# Patient Record
Sex: Male | Born: 1955 | Race: White | Hispanic: No | Marital: Married | Smoking: Former smoker
Health system: Southern US, Community
[De-identification: ages and names within clinical notes are randomized; demographics above are authoritative.]

## PROBLEM LIST (undated history)

## (undated) DIAGNOSIS — J45909 Unspecified asthma, uncomplicated: Secondary | ICD-10-CM

## (undated) DIAGNOSIS — Z87442 Personal history of urinary calculi: Secondary | ICD-10-CM

## (undated) DIAGNOSIS — I82409 Acute embolism and thrombosis of unspecified deep veins of unspecified lower extremity: Secondary | ICD-10-CM

## (undated) DIAGNOSIS — C801 Malignant (primary) neoplasm, unspecified: Secondary | ICD-10-CM

## (undated) DIAGNOSIS — F32A Depression, unspecified: Secondary | ICD-10-CM

## (undated) DIAGNOSIS — I2699 Other pulmonary embolism without acute cor pulmonale: Secondary | ICD-10-CM

## (undated) DIAGNOSIS — I739 Peripheral vascular disease, unspecified: Secondary | ICD-10-CM

## (undated) DIAGNOSIS — M1990 Unspecified osteoarthritis, unspecified site: Secondary | ICD-10-CM

## (undated) DIAGNOSIS — N189 Chronic kidney disease, unspecified: Secondary | ICD-10-CM

## (undated) DIAGNOSIS — F329 Major depressive disorder, single episode, unspecified: Secondary | ICD-10-CM

---

## 1998-04-24 HISTORY — PX: HIP RESECTION ARTHROPLASTY: SHX1759

## 1999-07-12 ENCOUNTER — Inpatient Hospital Stay (HOSPITAL_COMMUNITY): Admission: EM | Admit: 1999-07-12 | Discharge: 1999-07-15 | Payer: Self-pay | Admitting: *Deleted

## 2000-04-24 HISTORY — PX: CERVICAL LAMINECTOMY: SHX94

## 2004-03-14 ENCOUNTER — Emergency Department (HOSPITAL_COMMUNITY): Admission: EM | Admit: 2004-03-14 | Discharge: 2004-03-14 | Payer: Self-pay | Admitting: Emergency Medicine

## 2004-03-21 ENCOUNTER — Inpatient Hospital Stay (HOSPITAL_COMMUNITY): Admission: RE | Admit: 2004-03-21 | Discharge: 2004-03-24 | Payer: Self-pay | Admitting: Orthopedic Surgery

## 2004-11-20 ENCOUNTER — Inpatient Hospital Stay (HOSPITAL_COMMUNITY): Admission: RE | Admit: 2004-11-20 | Discharge: 2004-11-21 | Payer: Self-pay | Admitting: Family Medicine

## 2004-11-20 ENCOUNTER — Ambulatory Visit: Payer: Self-pay | Admitting: Family Medicine

## 2004-11-24 ENCOUNTER — Ambulatory Visit: Payer: Self-pay | Admitting: Sports Medicine

## 2004-11-28 ENCOUNTER — Ambulatory Visit: Payer: Self-pay | Admitting: Sports Medicine

## 2004-12-02 ENCOUNTER — Ambulatory Visit: Payer: Self-pay | Admitting: Family Medicine

## 2005-07-10 ENCOUNTER — Ambulatory Visit (HOSPITAL_COMMUNITY): Admission: RE | Admit: 2005-07-10 | Discharge: 2005-07-10 | Payer: Self-pay | Admitting: Orthopedic Surgery

## 2005-07-10 ENCOUNTER — Encounter: Payer: Self-pay | Admitting: Vascular Surgery

## 2005-08-09 ENCOUNTER — Ambulatory Visit: Payer: Self-pay | Admitting: Family Medicine

## 2006-04-24 HISTORY — PX: BACK SURGERY: SHX140

## 2008-04-24 HISTORY — PX: SHOULDER ARTHROSCOPY W/ ROTATOR CUFF REPAIR: SHX2400

## 2008-07-01 ENCOUNTER — Ambulatory Visit (HOSPITAL_COMMUNITY): Admission: RE | Admit: 2008-07-01 | Discharge: 2008-07-01 | Payer: Self-pay | Admitting: Obstetrics and Gynecology

## 2008-07-01 ENCOUNTER — Ambulatory Visit: Payer: Self-pay | Admitting: Vascular Surgery

## 2008-07-01 ENCOUNTER — Encounter (INDEPENDENT_AMBULATORY_CARE_PROVIDER_SITE_OTHER): Payer: Self-pay | Admitting: Family Medicine

## 2009-05-03 ENCOUNTER — Ambulatory Visit (HOSPITAL_COMMUNITY): Admission: RE | Admit: 2009-05-03 | Discharge: 2009-05-03 | Payer: Self-pay | Admitting: Specialist

## 2009-06-18 ENCOUNTER — Ambulatory Visit (HOSPITAL_COMMUNITY): Admission: RE | Admit: 2009-06-18 | Discharge: 2009-06-18 | Payer: Self-pay | Admitting: Orthopedic Surgery

## 2009-07-28 ENCOUNTER — Inpatient Hospital Stay (HOSPITAL_COMMUNITY): Admission: RE | Admit: 2009-07-28 | Discharge: 2009-07-29 | Payer: Self-pay | Admitting: Orthopedic Surgery

## 2009-12-17 ENCOUNTER — Ambulatory Visit (HOSPITAL_BASED_OUTPATIENT_CLINIC_OR_DEPARTMENT_OTHER): Admission: RE | Admit: 2009-12-17 | Discharge: 2009-12-17 | Payer: Self-pay | Admitting: Specialist

## 2010-07-07 LAB — POCT HEMOGLOBIN-HEMACUE: Hemoglobin: 16 g/dL (ref 13.0–17.0)

## 2010-07-13 LAB — CBC
HCT: 46.5 % (ref 39.0–52.0)
Hemoglobin: 15.9 g/dL (ref 13.0–17.0)
MCHC: 34.3 g/dL (ref 30.0–36.0)
MCV: 98.4 fL (ref 78.0–100.0)
Platelets: 220 10*3/uL (ref 150–400)
RBC: 4.73 MIL/uL (ref 4.22–5.81)
RDW: 13.2 % (ref 11.5–15.5)
WBC: 5.8 10*3/uL (ref 4.0–10.5)

## 2010-07-13 LAB — BASIC METABOLIC PANEL
BUN: 16 mg/dL (ref 6–23)
CO2: 23 mEq/L (ref 19–32)
Calcium: 9 mg/dL (ref 8.4–10.5)
Chloride: 109 mEq/L (ref 96–112)
Creatinine, Ser: 0.74 mg/dL (ref 0.4–1.5)
GFR calc Af Amer: >60 mL/min (ref >60)
GFR calc non Af Amer: >60 mL/min (ref >60)
Glucose, Bld: 101 mg/dL — ABNORMAL HIGH (ref 70–99)
Potassium: 4.1 mEq/L (ref 3.5–5.1)
Sodium: 138 mEq/L (ref 135–145)

## 2010-09-09 NOTE — Discharge Summary (Signed)
Spencer Cunningham                   ACCOUNT NO.:  [id number]   MEDICAL RECORD NO.:  [id number]          PATIENT TYPE:  INP   LOCATION:  5022                         FACILITY:  [facility]   PHYSICIAN:  Spencer Cunningham, M.D.    DATE OF BIRTH:  May 02, 1955   DATE OF ADMISSION:  11/20/2004  DATE OF DISCHARGE:  11/21/2004                                 DISCHARGE SUMMARY   PRIMARY CARE DOCTOR:  Dr. Leitha Cunningham.   CONSULTS:  None.   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   PROCEDURES:  Bilateral lower extremity Dopplers on November 20, 2004, showing  left lower extremity DVT.   DISCHARGE MEDICATIONS:  1.  Coumadin 10 mg, take 1 tab p.o. daily.  2.  Lovenox 100 mg subcutaneous b.i.d. x5 days.   HOSPITAL COURSE:  The patient was admitted on November 20, 2004, for swelling in  his left lower extremity concerning for DVT.  The patient had a history of  DVT.  Lower extremity Dopplers demonstrated DVT on the left lower extremity.  He was started on Coumadin and Lovenox.  At discharge the INR was still 1.1.  We arranged close followup with Dr. Ladon Cunningham, checking a PT on Thursday, November 24, 2004, and a followup appointment with Dr. Leitha Cunningham on November 28, 2004.   ADMISSION LAB:  White count 7.2, hemoglobin 14.1, hematocrit 41.4, platelets  177.  Sodium 136, potassium 3.4, chloride 106, CO2 24.  BUN 23, creatinine  0.9, glucose 107, calcium 8.2.  Antithrombin-3 79.  Cholesterol 207,  LDL  145, triglycerides 130, HDL 36.   DVT.  The patient will be sent home, treat the DVT with Lovenox x5 days and  Coumadin 10 mg daily.  We will recheck the INR on November 24, 2004.   DISCHARGE LABS:  CBC:  Hemoglobin 13.8, hematocrit 39.3, white count 6.5,  platelets 190.   FOLLOWUP ITEMS:  1.  PT/INR.  The patient will followup on November 24, 2004, at 9:30 a.m. for      checking his PT/INR.  2.  The patient will followup with his new primary care physician, Dr. Leitha Cunningham, on  November 28, 2004, at 3:30 p.m.  3.  The patient had some mildly elevated cholesterol values so Dr. Leitha Cunningham      may want to address this.  4.  Patient has some coagulopathy.  Labs pending including factor V Leiden,      etc., that should be followed up by Dr. Leitha Cunningham on November 28, 2004.      Spencer Cunningham   AL/MEDQ  D:  11/21/2004  T:  11/21/2004  Job:  [id number]   cc:   Spencer Mimes, MD  Fax: [phone]

## 2010-09-09 NOTE — H&P (Signed)
NAMEFARHAD, BURLESON                   ACCOUNT NO.:  [id number]   MEDICAL RECORD NO.:  [id number]          PATIENT TYPE:  INP   LOCATION:  5022                         FACILITY:  [facility]   PHYSICIAN:  Asencion Partridge, M.D.     DATE OF BIRTH:  Apr 07, 1956   DATE OF ADMISSION:  11/20/2004  DATE OF DISCHARGE:                                HISTORY & PHYSICAL   This with Dr. Jorja Loa on diagnoses and Dr.   CHIEF COMPLAINT:  DVT.   HISTORY OF PRESENT ILLNESS:  Mr. Hershberger is a 55 year old Caucasian male, whose  past medical history will be outlined below, who presented to [facility] on [address] this morning with complaints of right lower extremity  pain and swelling. He has had these symptoms for 2-3 days. He has  significant tenderness with walking. His left leg is asymptomatic. He denies  any symptoms such as chest pain or shortness of breath or pleuritic chest  pain. He was sent [facility] for Doppler evaluation to rule out a DVT, and  the patient was found to have a large DVT extending from the popliteal vein  down to the ankle and also including the peroneal and posterior tibial vein.  The patient is being admitted for initiation of anticoagulation and  Coumadin.   PAST MEDICAL HISTORY:  1.  History of DVT in either 2001 or 2003. Apparently the patient's neighbor      is a physician, and he diagnosed and primarily handled or managed this      supposed DVT without having to be in the hospital or be in the office.      It is actually unclear as to whether or not a true DVT was ever      confirmed. Nonetheless, the patient does remember being on Coumadin for      6 months but does not remember having lab values checked regularly while      he was on Coumadin or having to go to the office to visit the doctor for      this  2.  History of depression.   SURGICAL HISTORY:  .  Cervical diskectomy about 10 years ago.  1.  Hip replacement performed in November 2005   FAMILY HISTORY:  The  patient's father is in his 16s. He is overall healthy.  No diabetes, heart disease or history of blood clots. The patient's mother  is also in her 71s. She is also very healthy. He has three siblings all of  which have no medical problems and no history of blood clots. As far as he  can tell, there is no inherited tendency towards hypercoagulable state that  he knows of.   SOCIAL HISTORY:  The patient is married with three children. He smokes  primarily when he plays golf or works outside.  He states it averages about  a pack every 2 weeks. He denies any use of alcohol or IV drugs. He is  employed. He has the capability to work from home if needed   MEDICATIONS:  None.  ALLERGIES:  PENICILLIN.   REVIEW OF SYSTEMS:  As per HPI. The patient also denies any problems with  bleeding from his rectum or bloody stools. He denies any hemoptysis or  hematemesis. He denies any problems with significant bruising. He denies  problems with nausea, vomiting, dysuria or hematuria. He has put on about 20  pounds since his hip surgery   PHYSICAL EXAMINATION:  VITAL SIGNS:  Afebrile. Vital signs are stable.  GENERAL:  Pleasant Caucasian male in no apparent distress. He is awake,  alert and oriented x3. His mood and affect are appropriate.  HEENT:  PERRL. Moist oropharynx.  NECK:  Supple. No thyromegaly, no lymphadenopathy.  CHEST:  Clear to auscultation bilaterally. Normal effort.  CARDIOVASCULAR:  Regular rate. Normal S1-S2 no murmurs.  ABDOMEN:  Soft, nontender, nondistended. No hepatosplenomegaly. No masses  EXTREMITIES:  The right lower extremity is significantly swollen, firm, and  tender to touch. The left lower extremity is normal. He has +2 dorsalis  pedis pulses on the left, +1 on the right. There is no pallor of the right  lower extremity, and both lower extremities are warm and dry with normal  capillary refill.  NEUROLOGIC:  Grossly intact.  SKIN:  No evidence of rash, no evidence of  splinter hemorrhages, Osler's  nodes, or other thrombotic event   LABORATORY DATA:  A CBC, BMET, coag studies, as well as protein C, protein  S, antithrombin III and factor  V Leiden are all pending at this time.   EKG is pending at this time   ASSESSMENT/PLAN:  A 55 year old male admitted for recurring deep vein  thromboses.  1.  Deep vein thromboses. The patient will be started on therapeutic doses      of Lovenox, and we will start his Coumadin today. He was informed that      he will need lifelong Coumadin and will need levels monitored. The      patient has an understanding of this. He will likely be followed at the      [facility] for his primary care and Coumadin      followup. We will observe him overnight and then attempt to arrange for      home administration of the Lovenox starting tomorrow. I instructed him      that he will need daily administration of this until his levels are      therapeutic. He apparently has a golfing trip, and he is going to cancel      that for this week as he states that it is not necessary for him to go.   At this time the patient is free to ambulate at he desires. He was  instructed to notify us of any symptoms such as shortness of breath or  pleuritic chest pain, palpitations or tachycardia.      T   TV/MEDQ  D:  11/20/2004  T:  11/20/2004  Job:  [id number]

## 2010-09-09 NOTE — Op Note (Signed)
Spencer Cunningham, Spencer Cunningham                   ACCOUNT NO.:  [id number]   MEDICAL RECORD NO.:  [id number]          PATIENT TYPE:  INP   LOCATION:  0006                         FACILITY:  [facility]   PHYSICIAN:  Ollen Gross, M.D.    DATE OF BIRTH:  05/17/1955   DATE OF PROCEDURE:  03/21/2004  DATE OF DISCHARGE:                                 OPERATIVE REPORT   PREOPERATIVE DIAGNOSIS:  Osteoarthritis, right hip.   POSTOPERATIVE DIAGNOSIS:  Osteoarthritis, right hip.   PROCEDURE:  Right total hip arthroplasty.   SURGEON:  Ollen Gross, M.D.   ASSISTANT:  Alexzandrew L. Julien Girt, P.A.   ANESTHESIA:  General.   ESTIMATED BLOOD LOSS:  500.   DRAINS:  Hemovac x1.   COMPLICATIONS:  None.   DISPOSITION:  Stable to recovery.   BRIEF CLINICAL NOTE:  Spencer Cunningham is a 55 year old male who has end-stage  osteoarthritis of the right hip with intractable pain.  He has failed  nonoperative management and presents now for right total hip arthroplasty.   PROCEDURE IN DETAIL:  After successful administration of general anesthetic,  the patient was placed in the left lateral decubitus position with the right  side up and held with the hip positioning.  The right lower extremity was  isolated from the perineum.  It was prepped and draped in the usual sterile  fashion.  A short posterolateral incision was made with a 10-blade through  the subcutaneous tissue to the level of the fascia lata which was incised in  line with the skin incision.  Sciatica was palpated and protected and the  short rotator was isolated off the femur.  Capsulectomy was performed and  the hip was dislocated.  The trial prosthesis was placed at the center of  the trial head.  This responds to the center of his native femoral head.  Osteotomy lines more throughout the femoral neck and osteotomy was made with  an oscillating saw.  The femoral head was removed and the femur was  retracted anteriorly to gain acetabular exposure.   The very  large acetabular osteophyte circumferentially which were removed.  We began acetabular reaming at 47 mm forcing increments of 2 to 55 and a 56  mm Pinnacle acetabular shield was placed in anatomic position transfixed  with two dome screws.  Trial 36 mm neutral liner was placed.   The femur was prepared first with irrigation and then the canal finder.  Axial reaming was performed at the 15.5 mm and proximal reaming to an 18 D  and sleeve machines were large.  The 18 D large trial sleeve was placed and  an 18 x 13 stem and a 36 + 8 neck.  His native version was about 5 degrees  so we had an additional 10 degrees of anteversion.  A 36 + 0 trial head was  placed and then the hip was reduced.  There was outstanding stability of  full extension and full external rotation to 70 degrees of flexion, 40  degrees abduction, 90 degrees of internal rotation and 90 degrees of flexion  and 70  degrees of internal rotation.  By placing the right leg on top of the  left, it was found that the limb lengths were equal.  The hip was then  dislocated and all trials were removed.  The permanent apex of 4 mm placed  into the acetabular shell and the permanent 36 mm neutral Ultramat metal  liner was placed.  The permanent 20B large sleeve was placed and a 20 x 15  stem and a 36 + 8 neck placed once again anteverted beyond his anteversion.  The 36 + 0 head was placed and the hip was reduced in the same stability  parameters.  It was felt this was irrigated with antibiotic solution and  short rotators will be attached to the femur for drill holes.  The fascia  lata was closed over a Hemovac vein with interrupted #1 Vicryl.  Subcutaneous tissue was closed with #1 and 2-0 Vicryl and subcuticular  running 4-0 Monocryl.  A total of 20 mL of 40% Marcaine with epinephrine  were injected into the subcutaneous tissues.  The drains were hooked to  suction and a bulky sterile dressing was applied.  He was placed into a knee   immobilizer, awakened and transported to recovery in stable condition.     Drenda Freeze   FA/MEDQ  D:  03/21/2004  T:  03/21/2004  Job:  [id number]

## 2010-09-09 NOTE — H&P (Signed)
Spencer Cunningham, Spencer Cunningham                   ACCOUNT NO.:  [id number]   MEDICAL RECORD NO.:  [id number]          PATIENT TYPE:  INP   LOCATION:  NA                           FACILITY:  [facility]   PHYSICIAN:  Ollen Gross, M.D.    DATE OF BIRTH:  30-Aug-1955   DATE OF ADMISSION:  03/21/2004  DATE OF DISCHARGE:                                HISTORY & PHYSICAL   DATE OF OFFICE VISIT HISTORY AND PHYSICAL:  March 08, 2004.   DATE OF ADMISSION:  March 21, 2004.   CHIEF COMPLAINT:  Right hip pain.   HISTORY OF PRESENT ILLNESS:  The patient is a 55 year old male seen with a  greater than four year history of ongoing right hip pain.  The patient  states the pain is mainly in the groin and is traveling down through his  thigh.  It is affecting his whole leg.  He has very limited motion and it is  starting to interfere with his daily activities.  He states several people  in his family has had arthritic hips and had to have them replaced in their  40s.  He feels though this may be the problem.  He is seen in the office by  Dr. Lequita Halt.  He does state his hip is hurting him all the time even at  night.  X-rays demonstrate he does have severe end-stage arthritis of the  right hip with bone-on-bone changes and large osteophyte formation.  It is  at a point where the most predictable means of improving his pain and  function would be a total hip arthroplasty.  Procedure have been discussed  at length in detail with risks and benefits.  The patient elected to proceed  with surgery.   ALLERGIES:  PENICILLIN REACTION DURING CHILDHOOD YEARS WITH RASH AND HIVES.   CURRENT MEDICATIONS:  1.  Bromfed.  2.  Effexor.   PAST MEDICAL HISTORY:  Past history of a right leg deep venous thrombosis  following a traumatic injury.   PAST SURGICAL HISTORY:  Cervical fusion.   FAMILY HISTORY:  Mother with arthritis, has had bilateral knee replacements,  bilateral hip replacements.  Father age 34 in good  health.   SOCIAL HISTORY:  He is married with three children.  He is a past smoker,  but quit six months ago.  No alcohol intake.   REVIEW OF SYSTEMS:  GENERAL:  No fever or night sweats.  NEURO:  No seizure,  syncope, paralysis.  RESPIRATORY:  No shortness of breath, productive cough  or hemoptysis.  CARDIOVASCULAR:  No chest pain, angina, orthopnea.  GI:  No  nausea, vomiting, diarrhea, constipation.  GU:  No dysuria, hematuria,  discharge.  MUSCULOSKELETAL:  Pertinent of the right hip found on the  history of present illness.   PHYSICAL EXAMINATION:  VITAL SIGNS:  Pulse 64, respirations 14, blood  pressure 132/84.  GENERAL:  The patient is a 55 year old white male, well-nourished, well-  developed, no acute distress.  He is alert, oriented and cooperative.  Appears to be a good historian.  HEENT:  Normocephalic and  atraumatic.  Pupils are round and reactive.  Oropharynx is clear.  EOMs are intact.  NECK:  Supple, no carotid bruits.  CHEST:  Clear anterior and posterior chest wall, there is no rhonchi, rales,  wheezing are appreciated on exam.  HEART:  Regular rate and rhythm, no murmurs.  S1, S2 noted.  ABDOMEN:  Soft, nontender, bowel sounds present.  RECTAL/BREAST/GENITALIA:  Not done, not pertinent to present illness.  EXTREMITIES:  Significant to the right hip -- the right hip only shows  flexion limited about 70 degrees and there in no internal rotation, only  about 5 degrees of external rotation, abduction of about 10 degrees.  The  right leg appears to be about 3/8th of an inch shorter than the left leg.  Motor function is intact.   X-RAYS:  There was a preop chest x-ray which did show marked pleural  thickening seen along the left lateral chest wall.  It was recommend for  comparison with any prior outside films.  The patient did not have any prior  outside films, so arrangements will be made for him to have a CT scan  following surgery.   IMPRESSION:  1.   Osteoarthritis right hip.  2.  Past history of a right leg deep venous thrombosis following a traumatic      injury.  3.  Past history of smoking (quit six months ago).   PLAN:  The patient admitted to [facility] to undergo a right total  hip arthroplasty.  Surgery will be performed by Dr. Ollen Gross.     Alex   ALP/MEDQ  D:  03/20/2004  T:  03/20/2004  Job:  [id number]   cc:   Ollen Gross, M.D.  [facility]  [address]  [address]  [place]  [place] [address]  Fax: [phone]

## 2010-09-09 NOTE — Discharge Summary (Signed)
Spencer Cunningham, FITTON                   ACCOUNT NO.:  [id number]   MEDICAL RECORD NO.:  [id number]          PATIENT TYPE:  INP   LOCATION:  0468                         FACILITY:  [facility]   PHYSICIAN:  Ollen Gross, M.D.    DATE OF BIRTH:  08/31/1955   DATE OF ADMISSION:  03/21/2004  DATE OF DISCHARGE:  03/24/2004                                 DISCHARGE SUMMARY   ADMISSION DIAGNOSES:  1.  Osteoarthritis of right hip.  2.  Past history of right leg deep venous thrombosis following traumatic      injury.  3.  Past history of smoking (quit 6 months ago).   DISCHARGE DIAGNOSES:  1.  Osteoarthritis of right hip, status post right total hip arthroplasty.  2.  Mild postoperative blood loss anemia not requiring transfusion.  3.  Postoperative hyponatremia, improved.  4.  Marked pleural thickening, left lateral chest wall per preoperatively      chest x-ray.  Computed tomography pending.  5.  Past history of right leg deep venous thrombosis following traumatic      injury.  6.  Past history of smoking (quit 6 months ago).   PROCEDURE:  On March 21, 2004, right total hip arthroplasty, surgeon Dr.  Ollen Gross, assistant Alexzandrew L. Perkins, P.A.-C.  Anesthesia  general.  Blood loss 500 cc.  Hemovac drain x1.   CONSULTATIONS:  None.   HISTORY OF PRESENT ILLNESS:  Soren is a 55 year old male with end-stage  osteoarthritis of the right hip and Spencer Cunningham.  He has failed  nonoperative management and now presents for a right total hip arthroplasty.   LABORATORY DATA AND X-RAY FINDINGS:  Preop CBC showed hemoglobin of 15.4,  hematocrit 45.3, white cell count normal at 8.6; differential within normal  limits.  Postop hemoglobin 12.9.  Last H&H 11.7 and 34.0.  PT/PTT preop 12.4  and 25 respectively with an INR of 0.9.  Serial PTs followed with last  PT/INR 21.1 and 2.3.  Chem panel on admission with sodium on the lower side  of 134, BUN elevated at 26, albumin low at 3.4.  Remaining  Chem panel all  within normal limits.  Serial BMETs were followed.  Sodium did drop down to  130, but came back up to normal limit of 137.  BUN came down with fluids  postop to a normal level of 14.  Preop UA negative.  Followup UA negative.  Blood group type A positive.   EKG dated March 11, 2004, with normal sinus rhythm, normal EKG confirmed  by Dr. Peter Swaziland.  Right hip films dated on March 11, 2004, with  moderately severe right hip arthritis with near bone on bone contact  superior joint line and lumbar spine degenerative changes and osteitis  pubis.  Chest x-ray on March 11, 2004, marked pleural thickening seen  along left lateral chest wall.  Recommend comparison with any outside prior  chest radiographs the patient may have.  CT of the chest on March 23, 2004, with containing mass in the left lateral chest wall abutting the  pleura with  no definite underlying rib destruction.  Its appearance along  with three metastatic appearing liver lesions are worrisome for neoplasm.  Liposarcoma is within the differential diagnosis.  Mild dilatation of the  ascending aorta.   HOSPITAL COURSE:  The patient was admitted to [facility] for the  above procedure and tolerated it well and later returned to the recovery  room.  The patient was placed on PCA and p.o. analgesic for Cunningham control  following surgery.  Hemovac drain placed at the time of surgery was pulled  on day #1.  He underwent a metal on metal construct implant.  He did have a  history of DVT so he was placed on Coumadin and Lovenox for DVT prophylaxis.  On postop day #1, he was doing fairly well and did have a little bit of  abdominal discomfort and started Reglan and suppository.  Rechecked his UA  which was negative.  He started getting up with physical therapy.  The  patient did progress well with his physical therapy.  He was up ambulating  approximately 100 feet.  The following day, postop day #2, he  had two bowel  movements and was feeling much better.  His abdominal Cunningham had resolved.  Dressing was changed on postop day #2, and incision was healing well.  He  did very well with therapy and he was set up to go home the following  morning.  Originally, he was going to have the CT scan set up on an  outpatient basis, but they were able to work him in on the afternoon of  March 23, 2004.  On the following day, postop day #3, the patient was  doing quite well and had been seen in rounds.  Performed a CT of the chest  prior to him leaving.  The results were reviewed with the patient by myself  and Dr. Lequita Halt.  Recommend followup study done on a outpatient basis.  This  was discussed with him which will be set up at a later date.  Dr. Lequita Halt  spoke with Dr. Cyndie Chime and recommended CT scan with contrast which will  be set up later.  The patient was in agreement with followup on an  outpatient basis.  He was doing well from an orthopedic standpoint and it  was decided the patient could be discharged home.   DISPOSITION:  Discharged home on March 24, 2004.   DISCHARGE MEDICATIONS:  1.  Percocet.  2.  Skelaxin.  3.  Coumadin.  4.  Cipro.   FOLLOW UP:  Follow up 2 weeks from surgery.  Call the office for an  appointment.   ACTIVITY:  He is partial weightbearing 25-50% right lower extremity.  Home  health PT and home health nursing with hip precautions.   DIET:  As tolerated.   CONDITION ON DISCHARGE:  Improved.   SPECIAL INSTRUCTIONS:  It is recommended that he set up and schedule  outpatient CT with contrast for the abdomen at a later date as recommended  by Dr. Cyndie Chime to Dr. Lequita Halt.     Alex   ALP/MEDQ  D:  04/28/2004  T:  04/28/2004  Job:  [id number]

## 2010-11-20 ENCOUNTER — Emergency Department (HOSPITAL_COMMUNITY)
Admission: EM | Admit: 2010-11-20 | Discharge: 2010-11-20 | Discharge status: Against Medical Advice | Payer: 59 | Attending: Emergency Medicine | Admitting: Emergency Medicine

## 2010-11-20 DIAGNOSIS — T300 Burn of unspecified body region, unspecified degree: Secondary | ICD-10-CM | POA: Insufficient documentation

## 2010-11-20 DIAGNOSIS — X131XXA Other contact with steam and other hot vapors, initial encounter: Secondary | ICD-10-CM | POA: Insufficient documentation

## 2010-11-20 DIAGNOSIS — X12XXXA Contact with other hot fluids, initial encounter: Secondary | ICD-10-CM | POA: Insufficient documentation

## 2012-04-24 HISTORY — PX: PORTACATH PLACEMENT: SHX2246

## 2012-06-22 DIAGNOSIS — I2699 Other pulmonary embolism without acute cor pulmonale: Secondary | ICD-10-CM

## 2012-06-22 HISTORY — DX: Other pulmonary embolism without acute cor pulmonale: I26.99

## 2012-07-10 DIAGNOSIS — C099 Malignant neoplasm of tonsil, unspecified: Secondary | ICD-10-CM | POA: Insufficient documentation

## 2013-09-26 ENCOUNTER — Other Ambulatory Visit: Payer: Self-pay | Admitting: Orthopedic Surgery

## 2013-10-03 ENCOUNTER — Encounter (HOSPITAL_COMMUNITY): Payer: Self-pay | Admitting: Pharmacy Technician

## 2013-10-06 NOTE — Pre-Procedure Instructions (Signed)
Spencer Cunningham  10/06/2013   Your procedure is scheduled on:  Friday October 17, 2013 at 7:15 AM.  Report to [facility] Admitting  Entrance A   at 5:30 AM.  Call this number if you have problems the morning of surgery: [phone]   Remember:   Do not eat food or drink liquids after midnight.  On Thursday    Take these medicines the morning of surgery with A SIP OF WATER: Cevimeline (Evoxac)   Do not wear jewelry.  Do not wear lotions, powders, or cologne.   Men may shave face and neck.  Do not bring valuables to the hospital.  [facility] is not responsible for any belongings or valuables.               Contacts, dentures or bridgework may not be worn into surgery.  Leave suitcase in the car. After surgery it may be brought to your room.  For patients admitted to the hospital, discharge time is determined by your treatment team.               Patients discharged the day of surgery will not be allowed to drive home.  Name and phone number of your driver: Family/Friend  Special Instructions: Shower the night before and the morning of your surgery using CHG soap   Please read over the following fact sheets that you were given: Pain Booklet, Coughing and Deep Breathing, Blood Transfusion Information, Total Joint Packet, MRSA Information and Surgical Site Infection Prevention

## 2013-10-07 ENCOUNTER — Encounter (HOSPITAL_COMMUNITY): Payer: Self-pay

## 2013-10-07 ENCOUNTER — Encounter (HOSPITAL_COMMUNITY)
Admission: RE | Admit: 2013-10-07 | Discharge: 2013-10-07 | Disposition: A | Discharge status: Home / Self Care | Payer: Managed Care, Other (non HMO) | Source: Ambulatory Visit | Attending: Orthopedic Surgery | Admitting: Orthopedic Surgery

## 2013-10-07 ENCOUNTER — Ambulatory Visit (HOSPITAL_COMMUNITY)
Admission: RE | Admit: 2013-10-07 | Discharge: 2013-10-07 | Disposition: A | Discharge status: Home / Self Care | Payer: Managed Care, Other (non HMO) | Source: Ambulatory Visit | Attending: Orthopedic Surgery | Admitting: Orthopedic Surgery

## 2013-10-07 DIAGNOSIS — Z01812 Encounter for preprocedural laboratory examination: Secondary | ICD-10-CM | POA: Insufficient documentation

## 2013-10-07 DIAGNOSIS — Z01810 Encounter for preprocedural cardiovascular examination: Secondary | ICD-10-CM | POA: Insufficient documentation

## 2013-10-07 DIAGNOSIS — Z01818 Encounter for other preprocedural examination: Secondary | ICD-10-CM | POA: Insufficient documentation

## 2013-10-07 HISTORY — DX: Peripheral vascular disease, unspecified: I73.9

## 2013-10-07 HISTORY — DX: Unspecified osteoarthritis, unspecified site: M19.90

## 2013-10-07 HISTORY — DX: Major depressive disorder, single episode, unspecified: F32.9

## 2013-10-07 HISTORY — DX: Other pulmonary embolism without acute cor pulmonale: I26.99

## 2013-10-07 HISTORY — DX: Unspecified asthma, uncomplicated: J45.909

## 2013-10-07 HISTORY — DX: Depression, unspecified: F32.A

## 2013-10-07 HISTORY — DX: Chronic kidney disease, unspecified: N18.9

## 2013-10-07 HISTORY — DX: Malignant (primary) neoplasm, unspecified: C80.1

## 2013-10-07 LAB — COMPREHENSIVE METABOLIC PANEL
ALT: 9 U/L (ref 0–53)
AST: 18 U/L (ref 0–37)
Albumin: 3.5 g/dL (ref 3.5–5.2)
Alkaline Phosphatase: 54 U/L (ref 39–117)
BUN: 24 mg/dL — AB (ref 6–23)
CALCIUM: 9.4 mg/dL (ref 8.4–10.5)
CO2: 29 mEq/L (ref 19–32)
Chloride: 105 mEq/L (ref 96–112)
Creatinine, Ser: 0.81 mg/dL (ref 0.50–1.35)
GFR calc non Af Amer: >90 mL/min (ref >90)
Glucose, Bld: 81 mg/dL (ref 70–99)
Potassium: 4.5 mEq/L (ref 3.7–5.3)
Sodium: 144 mEq/L (ref 137–147)
TOTAL PROTEIN: 6.7 g/dL (ref 6.0–8.3)
Total Bilirubin: 0.4 mg/dL (ref 0.3–1.2)

## 2013-10-07 LAB — ABO/RH: ABO/RH(D): A POS

## 2013-10-07 LAB — CBC
HEMATOCRIT: 43.7 % (ref 39.0–52.0)
Hemoglobin: 14.9 g/dL (ref 13.0–17.0)
MCH: 33.2 pg (ref 26.0–34.0)
MCHC: 34.1 g/dL (ref 30.0–36.0)
MCV: 97.3 fL (ref 78.0–100.0)
Platelets: 188 10*3/uL (ref 150–400)
RBC: 4.49 MIL/uL (ref 4.22–5.81)
RDW: 13.3 % (ref 11.5–15.5)
WBC: 5 10*3/uL (ref 4.0–10.5)

## 2013-10-07 LAB — URINALYSIS, ROUTINE W REFLEX MICROSCOPIC
Bilirubin Urine: NEGATIVE
Glucose, UA: NEGATIVE mg/dL
HGB URINE DIPSTICK: NEGATIVE
KETONES UR: NEGATIVE mg/dL
Leukocytes, UA: NEGATIVE
Nitrite: NEGATIVE
PROTEIN: NEGATIVE mg/dL
Specific Gravity, Urine: 1.027 (ref 1.005–1.030)
Urobilinogen, UA: 0.2 mg/dL (ref 0.0–1.0)
pH: 5 (ref 5.0–8.0)

## 2013-10-07 LAB — SURGICAL PCR SCREEN
MRSA, PCR: NEGATIVE
STAPHYLOCOCCUS AUREUS: NEGATIVE

## 2013-10-07 LAB — TYPE AND SCREEN
ABO/RH(D): A POS
ANTIBODY SCREEN: NEGATIVE

## 2013-10-07 LAB — PROTIME-INR
INR: 1.39 (ref 0.00–1.49)
PROTHROMBIN TIME: 16.7 s — AB (ref 11.6–15.2)

## 2013-10-07 LAB — APTT: aPTT: 30 seconds (ref 24–37)

## 2013-10-07 NOTE — Progress Notes (Signed)
Pt. Denies ever having any advanced cardiac studies, no cardiac cath, no stress test or ECHO.  Pt. Reports history of PE - 2014, & still has DVT- R leg since many yrs  Ago.

## 2013-10-07 NOTE — Progress Notes (Signed)
Pt. Has long history of DVT.  He currently has a DVT remaining in the L leg at the knee cap. In 2014 he had a PE, hosp. In [facility] & treated with Heparin, then Lovenox , then coumadin & recently on Xarelto.  Pt. Knows to take last dose of Xarelto on Monday, 22nd. Pt. Denies having an ECHO in the past ( remarks also that he had a heart murmur as a child)

## 2013-10-16 ENCOUNTER — Other Ambulatory Visit: Payer: Self-pay | Admitting: Orthopedic Surgery

## 2013-10-16 MED ORDER — DEXAMETHASONE SODIUM PHOSPHATE 10 MG/ML IJ SOLN
10.0000 mg | Freq: Once | INTRAMUSCULAR | Status: AC
  Administered 2013-10-17: 10 mg via INTRAVENOUS
  Filled 2013-10-16: qty 1

## 2013-10-16 MED ORDER — BUPIVACAINE LIPOSOME 1.3 % IJ SUSP
20.0000 mL | Freq: Once | INTRAMUSCULAR | Status: DC
  Filled 2013-10-16: qty 20

## 2013-10-16 MED ORDER — SODIUM CHLORIDE 0.9 % IV SOLN: INTRAVENOUS | Status: DC

## 2013-10-16 MED ORDER — ACETAMINOPHEN 10 MG/ML IV SOLN
1000.0000 mg | Freq: Once | INTRAVENOUS | Status: AC
  Administered 2013-10-17: 1000 mg via INTRAVENOUS
  Filled 2013-10-16: qty 100

## 2013-10-16 MED ORDER — CHLORHEXIDINE GLUCONATE 4 % EX LIQD
60.0000 mL | Freq: Once | CUTANEOUS | Status: DC
  Filled 2013-10-16: qty 60

## 2013-10-16 MED ORDER — CEFAZOLIN SODIUM-DEXTROSE 2-3 GM-% IV SOLR
2.0000 g | INTRAVENOUS | Status: AC
  Administered 2013-10-17: 2 g via INTRAVENOUS
  Filled 2013-10-16: qty 50

## 2013-10-16 NOTE — H&P (Signed)
Spencer Cunningham. Sidor DOB: December 02, 1955 Married / Language: English / Race: White Male  Date of Admission:  10-17-2013  Chief Complaint:  Left Hip Pain  History of Present Illness The patient is a 58 year old male who comes in for a preoperative History and Physical. The patient is scheduled for a left total hip arthroplasty (anterior approach) to be performed by Dr. Dione Plover. Aluisio, MD at [facility] on 10-18-2014. The patient comes in today 9 years (1/2) out from right total hip arthroplasty. The patient states that he is doing very well on his right side. The patient reports left hip problems including pain symptoms that have been present for 1 year(s). The symptoms began without any known injury (Patient states that he has been having pain in his groin for over a year now. He said that the pain is constant and hurts with rest as well. He is not taking anything for pain and states that he is needing a replacement.).The patient feels as if their symptoms are does feel they are worsening. He has been having pain over the past year which has been progressive. Pain will radiate but mostly in the groin. It has become more debilitating and more unstable.[name] He can walk in a straight line but any twisting or turning is almost impossible. Lost motion in the hip and more difficult getting dress, going up and down steps, and getting in and our of the car. He knows that he is way past due for getting the hip replaced. He feels that he is much further that when he has the opposite hip done. He doesn't have a lot of pain at rest but pain at night and definitely weight bearing pain. He ia anxious about getting the hip replaced. He has be delayed coming in due to being diagnosed with throat cancer last year. He has completed his chemo and radiation and has had two consequtive clean PET scans and has been given the go ahead for hip surgery. Hip has been bothering him at all times. He would have had it  done sooner it was not for the issues with the throat cancer. He also had a pulmonary embolus. He has a history of hypercoagulable state. He is currently on long term Xarelto for that. He feels as though the hip is limiting his ability to do things he desires. It has really affected his lifestyle in a negative way. he is now ready to proceed with surgery on the left hip at this time. They have been treated conservatively in the past for the above stated problem and despite conservative measures, they continue to have progressive pain and severe functional limitations and dysfunction. They have failed non-operative management including home exercise, medications. It is felt that they would benefit from undergoing total joint replacement. Risks and benefits of the procedure have been discussed with the patient and they elect to proceed with surgery. There are no active contraindications to surgery such as ongoing infection or rapidly progressive neurological disease.   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 Tonsillar Pulmonary Embolism. treated with Xarelto Asthma Blood Clot. Right Leg Depression   Family History Depression. Paternal Grandfather. Heart Disease. Paternal Grandfather. Osteoarthritis. Mother. Rheumatoid Arthritis. Maternal Grandfather.    Social History Exercise. Exercises weekly; does running / walking and gym / weights Living situation. live with spouse Marital status. married Current work status. retired Careers information officer. 3 Tobacco / smoke  exposure. 07/10/2013: no Tobacco use. Former smoker. 07/10/2013 Number of flights of stairs before winded. 2-3 Never consumed alcohol. 07/10/2013: Never consumed alcohol No history of drug/alcohol rehab Not under pain contract Advance Directives. Living Will, Healthcare POA    Medication  History Xarelto (20MG  Tablet, Oral) Active. Simvastatin (40MG  Tablet, Oral) Active. Cymbalta ( Oral) Specific dose unknown - Active. (90mg ) Cevimeline HCl (30MG  Capsule, Oral) Active.    Past Surgical History Rotator Cuff Repair. right Spinal Surgery Total Hip Replacement. right Neck Disc Surgery   Review of Systems General:Not Present- Chills, Fever, Night Sweats, Fatigue, Weight Gain, Weight Loss and Memory Loss. Skin:Not Present- Hives, Itching, Rash, Eczema and Lesions. HEENT:Not Present- Tinnitus, Headache, Double Vision, Visual Loss, Hearing Loss and Dentures. Respiratory:Not Present- Shortness of breath with exertion, Shortness of breath at rest, Allergies, Coughing up blood and Chronic Cough. Cardiovascular:Not Present- Chest Pain, Racing/skipping heartbeats, Difficulty Breathing Lying Down, Murmur, Swelling and Palpitations. Gastrointestinal:Not Present- Bloody Stool, Heartburn, Abdominal Pain, Vomiting, Nausea, Constipation, Diarrhea, Difficulty Swallowing, Jaundice and Loss of appetitie. Male Genitourinary:Present- Urinary frequency, Weak urinary stream and Urinating at Night. Not Present- Blood in Urine, Discharge, Flank Pain, Incontinence, Painful Urination, Urgency and Urinary Retention. Musculoskeletal:Present- Joint Pain. Not Present- Muscle Weakness, Muscle Pain, Joint Swelling, Back Pain, Morning Stiffness and Spasms. Neurological:Not Present- Tremor, Dizziness, Blackout spells, Paralysis, Difficulty with balance and Weakness. Psychiatric:Not Present- Insomnia.    Vitals Weight: 176 lb Height: 68 in Weight was reported by patient. Height was reported by patient. Body Surface Area: 1.96 m Body Mass Index: 26.76 kg/m Resp.: 14 (Unlabored) BP: 112/68 (Sitting, Right Arm, Standard)     Physical Exam The physical exam findings are as follows:   General Mental Status - Alert, cooperative and good historian. General  Appearance- pleasant. Not in acute distress. Orientation- Oriented X3. Build & Nutrition- Well nourished and Well developed.   Head and Neck Head- normocephalic, atraumatic . Neck Global Assessment- supple. no bruit auscultated on the right and no bruit auscultated on the left.   Eye Pupil- Bilateral- Regular and Round. Motion- Bilateral- EOMI.   Chest and Lung Exam Auscultation: Breath sounds:- clear at anterior chest wall and - clear at posterior chest wall. Adventitious sounds:- No Adventitious sounds.   Cardiovascular Auscultation:Rhythm- Regular rate and rhythm. Heart Sounds- S1 WNL and S2 WNL. Murmurs & Other Heart Sounds:Auscultation of the heart reveals - No Murmurs.   Abdomen Palpation/Percussion:Tenderness- Abdomen is non-tender to palpation. Rigidity (guarding)- Abdomen is soft. Auscultation:Auscultation of the abdomen reveals - Bowel sounds normal.   Male Genitourinary  Not done, not pertinent to present illness  Musculoskeletal Musculoskeletal: His right hip can be flexed to 120, rotated in 30, out 40, abduct 40 without discomfort. Left hip flexion 95. No internal rotation, about 10 external rotation, 10 abduction. He has normal pulses and sensation in there. Motor is intact.  X-RAYS: AP pelvis, lateral left hip show bone on bone arthritis of the left hip with subchondral cystic formation with large superolateral osteophytes.   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 Wynelle Link.  Plan is to go home.  PCP - Dr. Garner Nash - Patient has been seen preoperatively and felt to be stable for surgery.  The patient will not receive TXA (tranexamic acid) due to: History of PE, Cancer History  Signed electronically by Joelene Millin, III PA-C

## 2013-10-17 ENCOUNTER — Encounter (HOSPITAL_COMMUNITY): Payer: Managed Care, Other (non HMO) | Admitting: Anesthesiology

## 2013-10-17 ENCOUNTER — Encounter (HOSPITAL_COMMUNITY)
Admission: RE | Disposition: A | Discharge status: Home Health | Payer: Self-pay | Source: Ambulatory Visit | Attending: Orthopedic Surgery

## 2013-10-17 ENCOUNTER — Inpatient Hospital Stay (HOSPITAL_COMMUNITY)
Admission: RE | Admit: 2013-10-17 | Discharge: 2013-10-19 | DRG: 470 | Disposition: A | Discharge status: Home Health | Payer: Managed Care, Other (non HMO) | Source: Ambulatory Visit | Attending: Orthopedic Surgery | Admitting: Orthopedic Surgery

## 2013-10-17 ENCOUNTER — Inpatient Hospital Stay (HOSPITAL_COMMUNITY): Payer: Managed Care, Other (non HMO)

## 2013-10-17 ENCOUNTER — Inpatient Hospital Stay (HOSPITAL_COMMUNITY): Payer: Managed Care, Other (non HMO) | Admitting: Anesthesiology

## 2013-10-17 ENCOUNTER — Encounter (HOSPITAL_COMMUNITY): Payer: Self-pay | Admitting: *Deleted

## 2013-10-17 DIAGNOSIS — Z880 Allergy status to penicillin: Secondary | ICD-10-CM

## 2013-10-17 DIAGNOSIS — D6859 Other primary thrombophilia: Secondary | ICD-10-CM | POA: Diagnosis present

## 2013-10-17 DIAGNOSIS — N189 Chronic kidney disease, unspecified: Secondary | ICD-10-CM | POA: Diagnosis present

## 2013-10-17 DIAGNOSIS — F3289 Other specified depressive episodes: Secondary | ICD-10-CM | POA: Diagnosis present

## 2013-10-17 DIAGNOSIS — Z86711 Personal history of pulmonary embolism: Secondary | ICD-10-CM

## 2013-10-17 DIAGNOSIS — Z87891 Personal history of nicotine dependence: Secondary | ICD-10-CM

## 2013-10-17 DIAGNOSIS — M1610 Unilateral primary osteoarthritis, unspecified hip: Principal | ICD-10-CM | POA: Diagnosis present

## 2013-10-17 DIAGNOSIS — Z96649 Presence of unspecified artificial hip joint: Secondary | ICD-10-CM

## 2013-10-17 DIAGNOSIS — Z8501 Personal history of malignant neoplasm of esophagus: Secondary | ICD-10-CM

## 2013-10-17 DIAGNOSIS — M169 Osteoarthritis of hip, unspecified: Secondary | ICD-10-CM

## 2013-10-17 DIAGNOSIS — D62 Acute posthemorrhagic anemia: Secondary | ICD-10-CM | POA: Diagnosis not present

## 2013-10-17 DIAGNOSIS — I739 Peripheral vascular disease, unspecified: Secondary | ICD-10-CM | POA: Diagnosis present

## 2013-10-17 DIAGNOSIS — F329 Major depressive disorder, single episode, unspecified: Secondary | ICD-10-CM | POA: Diagnosis present

## 2013-10-17 HISTORY — PX: TOTAL HIP ARTHROPLASTY: SHX124

## 2013-10-17 SURGERY — ARTHROPLASTY, HIP, TOTAL, ANTERIOR APPROACH
Anesthesia: General | Site: Hip | Laterality: Left

## 2013-10-17 MED ORDER — NEOSTIGMINE METHYLSULFATE 10 MG/10ML IV SOLN
INTRAVENOUS | Status: AC
  Filled 2013-10-17: qty 1

## 2013-10-17 MED ORDER — LIDOCAINE HCL (CARDIAC) 20 MG/ML IV SOLN
INTRAVENOUS | Status: AC
  Filled 2013-10-17: qty 5

## 2013-10-17 MED ORDER — ROCURONIUM BROMIDE 50 MG/5ML IV SOLN
INTRAVENOUS | Status: AC
  Filled 2013-10-17: qty 1

## 2013-10-17 MED ORDER — ROCURONIUM BROMIDE 100 MG/10ML IV SOLN
INTRAVENOUS | Status: DC | PRN
  Administered 2013-10-17: 50 mg via INTRAVENOUS

## 2013-10-17 MED ORDER — BUPIVACAINE LIPOSOME 1.3 % IJ SUSP
INTRAMUSCULAR | Status: DC | PRN
  Administered 2013-10-17: 20 mL

## 2013-10-17 MED ORDER — GLYCOPYRROLATE 0.2 MG/ML IJ SOLN
INTRAMUSCULAR | Status: DC | PRN
  Administered 2013-10-17: 0.6 mg via INTRAVENOUS

## 2013-10-17 MED ORDER — HYDROMORPHONE HCL PF 1 MG/ML IJ SOLN: 0.2500 mg | INTRAMUSCULAR | Status: DC | PRN

## 2013-10-17 MED ORDER — FENTANYL CITRATE 0.05 MG/ML IJ SOLN
INTRAMUSCULAR | Status: AC
  Filled 2013-10-17: qty 5

## 2013-10-17 MED ORDER — BISACODYL 10 MG RE SUPP: 10.0000 mg | Freq: Every day | RECTAL | Status: DC | PRN

## 2013-10-17 MED ORDER — BUPIVACAINE HCL (PF) 0.25 % IJ SOLN
INTRAMUSCULAR | Status: AC
  Filled 2013-10-17: qty 30

## 2013-10-17 MED ORDER — ACETAMINOPHEN 325 MG PO TABS: 650.0000 mg | ORAL_TABLET | Freq: Four times a day (QID) | ORAL | Status: DC | PRN

## 2013-10-17 MED ORDER — BUPIVACAINE HCL 0.25 % IJ SOLN
INTRAMUSCULAR | Status: DC | PRN
  Administered 2013-10-17: 20 mL

## 2013-10-17 MED ORDER — RIVAROXABAN 10 MG PO TABS
10.0000 mg | ORAL_TABLET | Freq: Every day | ORAL | Status: DC
Start: 2013-10-18 — End: 2013-10-19
  Administered 2013-10-18 – 2013-10-19 (×2): 10 mg via ORAL
  Filled 2013-10-17 (×3): qty 1

## 2013-10-17 MED ORDER — DULOXETINE HCL 60 MG PO CPEP
90.0000 mg | ORAL_CAPSULE | Freq: Every day | ORAL | Status: DC
  Administered 2013-10-17: 90 mg via ORAL
  Filled 2013-10-17 (×2): qty 1

## 2013-10-17 MED ORDER — KETOROLAC TROMETHAMINE 15 MG/ML IJ SOLN: 7.5000 mg | Freq: Four times a day (QID) | INTRAMUSCULAR | Status: AC | PRN

## 2013-10-17 MED ORDER — ONDANSETRON HCL 4 MG/2ML IJ SOLN
INTRAMUSCULAR | Status: AC
  Filled 2013-10-17: qty 2

## 2013-10-17 MED ORDER — MIDAZOLAM HCL 5 MG/5ML IJ SOLN
INTRAMUSCULAR | Status: DC | PRN
  Administered 2013-10-17: 2 mg via INTRAVENOUS

## 2013-10-17 MED ORDER — FENTANYL CITRATE 0.05 MG/ML IJ SOLN
INTRAMUSCULAR | Status: DC | PRN
  Administered 2013-10-17 (×2): 50 ug via INTRAVENOUS
  Administered 2013-10-17 (×2): 100 ug via INTRAVENOUS
  Administered 2013-10-17: 50 ug via INTRAVENOUS
  Administered 2013-10-17: 100 ug via INTRAVENOUS
  Administered 2013-10-17: 50 ug via INTRAVENOUS

## 2013-10-17 MED ORDER — LACTATED RINGERS IV SOLN
INTRAVENOUS | Status: DC | PRN
  Administered 2013-10-17 (×3): via INTRAVENOUS

## 2013-10-17 MED ORDER — CEVIMELINE HCL 30 MG PO CAPS
30.0000 mg | ORAL_CAPSULE | Freq: Three times a day (TID) | ORAL | Status: AC
  Administered 2013-10-17: 30 mg via ORAL
  Filled 2013-10-17 (×2): qty 1

## 2013-10-17 MED ORDER — SIMVASTATIN 40 MG PO TABS
40.0000 mg | ORAL_TABLET | Freq: Every day | ORAL | Status: DC
  Administered 2013-10-18 – 2013-10-19 (×2): 40 mg via ORAL
  Filled 2013-10-17 (×3): qty 1

## 2013-10-17 MED ORDER — EPHEDRINE SULFATE 50 MG/ML IJ SOLN
INTRAMUSCULAR | Status: DC | PRN
  Administered 2013-10-17: 10 mg via INTRAVENOUS
  Administered 2013-10-17: 20 mg via INTRAVENOUS

## 2013-10-17 MED ORDER — METOCLOPRAMIDE HCL 5 MG/ML IJ SOLN: 5.0000 mg | Freq: Three times a day (TID) | INTRAMUSCULAR | Status: DC | PRN

## 2013-10-17 MED ORDER — 0.9 % SODIUM CHLORIDE (POUR BTL) OPTIME
TOPICAL | Status: DC | PRN
  Administered 2013-10-17: 200 mL

## 2013-10-17 MED ORDER — ACETAMINOPHEN 500 MG PO TABS
1000.0000 mg | ORAL_TABLET | Freq: Four times a day (QID) | ORAL | Status: AC
  Administered 2013-10-17 – 2013-10-18 (×4): 1000 mg via ORAL
  Filled 2013-10-17 (×4): qty 2

## 2013-10-17 MED ORDER — METHOCARBAMOL 500 MG PO TABS
500.0000 mg | ORAL_TABLET | Freq: Four times a day (QID) | ORAL | Status: DC | PRN
  Administered 2013-10-18 – 2013-10-19 (×3): 500 mg via ORAL
  Filled 2013-10-17 (×3): qty 1

## 2013-10-17 MED ORDER — METOCLOPRAMIDE HCL 10 MG PO TABS: 5.0000 mg | ORAL_TABLET | Freq: Three times a day (TID) | ORAL | Status: DC | PRN

## 2013-10-17 MED ORDER — ONDANSETRON HCL 4 MG/2ML IJ SOLN: 4.0000 mg | Freq: Four times a day (QID) | INTRAMUSCULAR | Status: DC | PRN

## 2013-10-17 MED ORDER — ACETAMINOPHEN 650 MG RE SUPP: 650.0000 mg | Freq: Four times a day (QID) | RECTAL | Status: DC | PRN

## 2013-10-17 MED ORDER — ONDANSETRON HCL 4 MG/2ML IJ SOLN
INTRAMUSCULAR | Status: DC | PRN
  Administered 2013-10-17: 4 mg via INTRAVENOUS

## 2013-10-17 MED ORDER — NEOSTIGMINE METHYLSULFATE 10 MG/10ML IV SOLN
INTRAVENOUS | Status: DC | PRN
  Administered 2013-10-17: 4 mg via INTRAVENOUS

## 2013-10-17 MED ORDER — DEXTROSE-NACL 5-0.45 % IV SOLN
INTRAVENOUS | Status: DC
  Administered 2013-10-17: 18:00:00 via INTRAVENOUS
  Administered 2013-10-18: 100 mL/h via INTRAVENOUS

## 2013-10-17 MED ORDER — EPHEDRINE SULFATE 50 MG/ML IJ SOLN
INTRAMUSCULAR | Status: AC
  Filled 2013-10-17: qty 1

## 2013-10-17 MED ORDER — GLYCOPYRROLATE 0.2 MG/ML IJ SOLN
INTRAMUSCULAR | Status: AC
  Filled 2013-10-17: qty 3

## 2013-10-17 MED ORDER — TRAZODONE HCL 50 MG PO TABS
50.0000 mg | ORAL_TABLET | Freq: Every evening | ORAL | Status: DC | PRN
  Administered 2013-10-17 – 2013-10-18 (×2): 50 mg via ORAL
  Filled 2013-10-17 (×2): qty 1

## 2013-10-17 MED ORDER — ONDANSETRON HCL 4 MG/2ML IJ SOLN: 4.0000 mg | Freq: Once | INTRAMUSCULAR | Status: DC | PRN

## 2013-10-17 MED ORDER — PNEUMOCOCCAL VAC POLYVALENT 25 MCG/0.5ML IJ INJ
0.5000 mL | INJECTION | INTRAMUSCULAR | Status: AC
  Administered 2013-10-18: 0.5 mL via INTRAMUSCULAR
  Filled 2013-10-17 (×2): qty 0.5

## 2013-10-17 MED ORDER — DOCUSATE SODIUM 100 MG PO CAPS
100.0000 mg | ORAL_CAPSULE | Freq: Two times a day (BID) | ORAL | Status: DC
  Administered 2013-10-17 – 2013-10-19 (×4): 100 mg via ORAL
  Filled 2013-10-17 (×6): qty 1

## 2013-10-17 MED ORDER — MENTHOL 3 MG MT LOZG: 1.0000 | LOZENGE | OROMUCOSAL | Status: DC | PRN

## 2013-10-17 MED ORDER — FLEET ENEMA 7-19 GM/118ML RE ENEM
1.0000 | ENEMA | Freq: Once | RECTAL | Status: AC | PRN
Start: 2013-10-17 — End: 2013-10-17

## 2013-10-17 MED ORDER — PHENOL 1.4 % MT LIQD: 1.0000 | OROMUCOSAL | Status: DC | PRN

## 2013-10-17 MED ORDER — SODIUM CHLORIDE 0.9 % IJ SOLN
INTRAMUSCULAR | Status: DC | PRN
  Administered 2013-10-17: 30 mL via INTRAVENOUS

## 2013-10-17 MED ORDER — METHOCARBAMOL 500 MG PO TABS: 500.0000 mg | ORAL_TABLET | Freq: Four times a day (QID) | ORAL | Status: DC | PRN

## 2013-10-17 MED ORDER — MORPHINE SULFATE 2 MG/ML IJ SOLN
1.0000 mg | INTRAMUSCULAR | Status: DC | PRN
  Administered 2013-10-17: 2 mg via INTRAVENOUS
  Filled 2013-10-17: qty 1

## 2013-10-17 MED ORDER — ONDANSETRON HCL 4 MG PO TABS: 4.0000 mg | ORAL_TABLET | Freq: Four times a day (QID) | ORAL | Status: DC | PRN

## 2013-10-17 MED ORDER — OXYCODONE HCL 5 MG PO TABS
5.0000 mg | ORAL_TABLET | ORAL | Status: DC | PRN
  Administered 2013-10-18: 10 mg via ORAL
  Administered 2013-10-18 (×2): 5 mg via ORAL
  Administered 2013-10-18 – 2013-10-19 (×3): 10 mg via ORAL
  Filled 2013-10-17: qty 1
  Filled 2013-10-17 (×2): qty 2
  Filled 2013-10-17: qty 1
  Filled 2013-10-17 (×2): qty 2

## 2013-10-17 MED ORDER — POLYETHYLENE GLYCOL 3350 17 G PO PACK: 17.0000 g | PACK | Freq: Every day | ORAL | Status: DC | PRN

## 2013-10-17 MED ORDER — CEFAZOLIN SODIUM-DEXTROSE 2-3 GM-% IV SOLR
2.0000 g | Freq: Four times a day (QID) | INTRAVENOUS | Status: AC
  Administered 2013-10-17 (×2): 2 g via INTRAVENOUS
  Filled 2013-10-17 (×2): qty 50

## 2013-10-17 MED ORDER — DIPHENHYDRAMINE HCL 12.5 MG/5ML PO ELIX: 12.5000 mg | ORAL_SOLUTION | ORAL | Status: DC | PRN

## 2013-10-17 MED ORDER — PROPOFOL 10 MG/ML IV BOLUS
INTRAVENOUS | Status: AC
  Filled 2013-10-17: qty 20

## 2013-10-17 MED ORDER — OXYCODONE HCL 5 MG PO TABS: 5.0000 mg | ORAL_TABLET | ORAL | Status: DC | PRN

## 2013-10-17 MED ORDER — MIDAZOLAM HCL 2 MG/2ML IJ SOLN
INTRAMUSCULAR | Status: AC
Start: 2013-10-17 — End: 2013-10-17
  Filled 2013-10-17: qty 2

## 2013-10-17 MED ORDER — STERILE WATER FOR INJECTION IJ SOLN
INTRAMUSCULAR | Status: AC
  Filled 2013-10-17: qty 10

## 2013-10-17 MED ORDER — PROPOFOL 10 MG/ML IV BOLUS
INTRAVENOUS | Status: DC | PRN
  Administered 2013-10-17: 130 mg via INTRAVENOUS

## 2013-10-17 MED ORDER — METHOCARBAMOL 1000 MG/10ML IJ SOLN
500.0000 mg | Freq: Four times a day (QID) | INTRAVENOUS | Status: DC | PRN
  Filled 2013-10-17: qty 5

## 2013-10-17 MED ORDER — DEXAMETHASONE 6 MG PO TABS
10.0000 mg | ORAL_TABLET | Freq: Every day | ORAL | Status: AC
  Administered 2013-10-18: 10 mg via ORAL
  Filled 2013-10-17: qty 1

## 2013-10-17 MED ORDER — DEXAMETHASONE SODIUM PHOSPHATE 10 MG/ML IJ SOLN
10.0000 mg | Freq: Every day | INTRAMUSCULAR | Status: AC
  Filled 2013-10-17: qty 1

## 2013-10-17 MED ORDER — LIDOCAINE HCL (CARDIAC) 20 MG/ML IV SOLN
INTRAVENOUS | Status: DC | PRN
  Administered 2013-10-17: 50 mg via INTRAVENOUS

## 2013-10-17 SURGICAL SUPPLY — 46 items
BLADE SAW SGTL 18X1.27X75 (BLADE) ×2 IMPLANT
BLADE SAW SGTL 18X1.27X75MM (BLADE) ×1
CAPT HIP PF COP ×2 IMPLANT
CLOSURE STERI-STRIP 1/2X4 (GAUZE/BANDAGES/DRESSINGS) ×1
CLSR STERI-STRIP ANTIMIC 1/2X4 (GAUZE/BANDAGES/DRESSINGS) ×3 IMPLANT
DECANTER SPIKE VIAL GLASS SM (MISCELLANEOUS) ×3 IMPLANT
DRAPE C-ARM 42X72 X-RAY (DRAPES) ×3 IMPLANT
DRAPE STERI IOBAN 125X83 (DRAPES) ×3 IMPLANT
DRAPE U-SHAPE 47X51 STRL (DRAPES) ×9 IMPLANT
DRSG ADAPTIC 3X8 NADH LF (GAUZE/BANDAGES/DRESSINGS) ×3 IMPLANT
DRSG MEPILEX BORDER 4X4 (GAUZE/BANDAGES/DRESSINGS) ×2 IMPLANT
DRSG MEPILEX BORDER 4X8 (GAUZE/BANDAGES/DRESSINGS) ×3 IMPLANT
DURAPREP 26ML APPLICATOR (WOUND CARE) ×3 IMPLANT
ELECT BLADE 6.5 EXT (BLADE) ×3 IMPLANT
ELECT REM PT RETURN 9FT ADLT (ELECTROSURGICAL) ×3
ELECTRODE REM PT RTRN 9FT ADLT (ELECTROSURGICAL) ×1 IMPLANT
EVACUATOR 1/8 PVC DRAIN (DRAIN) ×3 IMPLANT
FACESHIELD WRAPAROUND (MASK) ×12 IMPLANT
FACESHIELD WRAPAROUND OR TEAM (MASK) ×4 IMPLANT
GAUZE VASELINE 3X9 (GAUZE/BANDAGES/DRESSINGS) ×2 IMPLANT
GLOVE BIO SURGEON STRL SZ8 (GLOVE) ×6 IMPLANT
GLOVE BIOGEL PI IND STRL 8 (GLOVE) ×1 IMPLANT
GLOVE BIOGEL PI INDICATOR 8 (GLOVE) ×2
GLOVE ECLIPSE 8.0 STRL XLNG CF (GLOVE) ×3 IMPLANT
GOWN STRL REUS W/ TWL LRG LVL3 (GOWN DISPOSABLE) ×1 IMPLANT
GOWN STRL REUS W/ TWL XL LVL3 (GOWN DISPOSABLE) ×1 IMPLANT
GOWN STRL REUS W/TWL LRG LVL3 (GOWN DISPOSABLE) ×3
GOWN STRL REUS W/TWL XL LVL3 (GOWN DISPOSABLE) ×3
KIT BASIN OR (CUSTOM PROCEDURE TRAY) ×3 IMPLANT
NDL 18GX1X1/2 (RX/OR ONLY) (NEEDLE) ×1 IMPLANT
NDL SAFETY ECLIPSE 18X1.5 (NEEDLE) ×1 IMPLANT
NEEDLE 18GX1X1/2 (RX/OR ONLY) (NEEDLE) ×3 IMPLANT
NEEDLE 22X1 1/2 (OR ONLY) (NEEDLE) ×3 IMPLANT
NEEDLE HYPO 18GX1.5 SHARP (NEEDLE) ×3
PACK TOTAL JOINT (CUSTOM PROCEDURE TRAY) ×3 IMPLANT
SUT ETHIBOND NAB CT1 #1 30IN (SUTURE) ×9 IMPLANT
SUT MNCRL AB 4-0 PS2 18 (SUTURE) ×3 IMPLANT
SUT VIC AB 1 CT1 27 (SUTURE) ×3
SUT VIC AB 1 CT1 27XBRD ANTBC (SUTURE) ×1 IMPLANT
SUT VIC AB 2-0 CT1 27 (SUTURE) ×6
SUT VIC AB 2-0 CT1 TAPERPNT 27 (SUTURE) ×2 IMPLANT
SUT VLOC 180 0 24IN GS25 (SUTURE) ×3 IMPLANT
SYR 20CC LL (SYRINGE) ×3 IMPLANT
SYR 50ML LL SCALE MARK (SYRINGE) ×3 IMPLANT
TOWEL OR 17X26 10 PK STRL BLUE (TOWEL DISPOSABLE) ×6 IMPLANT
TRAY FOLEY CATH 16FR SILVER (SET/KITS/TRAYS/PACK) ×3 IMPLANT

## 2013-10-17 NOTE — Anesthesia Postprocedure Evaluation (Signed)
  Anesthesia Post-op Note  Patient: Spencer Cunningham  Procedure(s) Performed: Procedure(s): LEFT TOTAL HIP ARTHROPLASTY ANTERIOR APPROACH (Left)  Patient Location: PACU  Anesthesia Type:General  Level of Consciousness: awake  Airway and Oxygen Therapy: Patient Spontanous Breathing  Post-op Pain: mild  Post-op Assessment: Post-op Vital signs reviewed  Post-op Vital Signs: Reviewed and stable  Last Vitals:  Filed Vitals:   10/17/13 0930  BP:   Pulse: 53  Temp:   Resp: 9    Complications: No apparent anesthesia complications

## 2013-10-17 NOTE — Evaluation (Signed)
Physical Therapy Evaluation Patient Details Name: Spencer Cunningham MRN: [id number] DOB: [birth year] Today's Date: 10/17/2013   History of Present Illness  Pt admitted for Left direct anterior THA with hx of prior R posterior THA 9 yrs ago  Clinical Impression  Pt moving well POD#0 however limited by orthostatic hypotension with presyncope with gait. BP 69/46 with sitting in room prior to return to bed and EOB 128/100. Pt educated for syncope limiting function and progression of mobility, RN aware and assessing pt as well. Pt with below deficits who will benefit from acute therapy to maximize mobility, function and gait to return pt to PLOF and decrease burden of care.     Follow Up Recommendations Outpatient PT    Equipment Recommendations  None recommended by PT    Recommendations for Other Services       Precautions / Restrictions Precautions Precautions: Fall Precaution Comments: no precaution, direct anterior Restrictions LLE Weight Bearing: Weight bearing as tolerated      Mobility  Bed Mobility Overal bed mobility: Modified Independent                Transfers Overall transfer level: Needs assistance   Transfers: Sit to/from Stand;Stand Pivot Transfers Sit to Stand: Supervision;Min assist Stand pivot transfers: Min assist       General transfer comment: Pt supervision initial stand from bed. pt with presyncope in hallway and mod assist to get pt to sit as well as min assist for transfer chair to recliner once back in room  Ambulation/Gait Ambulation/Gait assistance: Min guard Ambulation Distance (Feet): 50 Feet Assistive device: Rolling walker (2 wheeled) Gait Pattern/deviations: Step-through pattern;Decreased stride length   Gait velocity interpretation: Below normal speed for age/gender    Stairs            Wheelchair Mobility    Modified Rankin (Stroke Patients Only)       Balance                                              Pertinent Vitals/Pain Pain 3/10 end of session    [place] expects to be discharged to:: Private residence Living Arrangements: Spouse/significant other Available Help at Discharge: Family Type of Home: House Home Access: Stairs to enter   Technical brewer of Steps: 4 Home Layout: One level Home Equipment: Walker - 2 wheels;Crutches;Bedside commode      Prior Function Level of Independence: Independent               Hand Dominance        Extremity/Trunk Assessment   Upper Extremity Assessment: Overall WFL for tasks assessed           Lower Extremity Assessment: LLE deficits/detail   LLE Deficits / Details: decreased ROM as expected post op  Cervical / Trunk Assessment: Normal  Communication   Communication: No difficulties  Cognition Arousal/Alertness: Awake/alert Behavior During Therapy: WFL for tasks assessed/performed Overall Cognitive Status: Within Functional Limits for tasks assessed                      General Comments      Exercises        Assessment/Plan    PT Assessment Patient needs continued PT services  PT Diagnosis Difficulty walking;Acute pain   PT Problem List Decreased range of motion;Decreased strength;Decreased activity tolerance;Decreased mobility;Pain;Decreased knowledge of  use of DME  PT Treatment Interventions DME instruction;Gait training;Stair training;Functional mobility training;Therapeutic activities;Therapeutic exercise;Patient/family education   PT Goals (Current goals can be found in the Care Plan section) Acute Rehab PT Goals Patient Stated Goal: go golfing PT Goal Formulation: With patient Time For Goal Achievement: 10/24/13 Potential to Achieve Goals: Good    Frequency 7X/week   Barriers to discharge        Co-evaluation               End of Session   Activity Tolerance: Other (comment) (pt limited by hypotension with activity) Patient left: in bed;with call  bell/phone within reach;with nursing/sitter in room Nurse Communication: Mobility status;Precautions         Time: 5784-6962 PT Time Calculation (min): 34 min   Charges:   PT Evaluation $Initial PT Evaluation Tier I: 1 Procedure PT Treatments $Gait Training: 8-22 mins $Therapeutic Activity: 8-22 mins   PT G CodesMelford Aase 10/17/2013, 3:11 PM Elwyn Reach, [place]

## 2013-10-17 NOTE — Plan of Care (Signed)
Problem: Consults Goal: Diagnosis- Total Joint Replacement Primary Total Hip Left     

## 2013-10-17 NOTE — Op Note (Signed)
OPERATIVE REPORT  PREOPERATIVE DIAGNOSIS: Osteoarthritis of the Left hip.   POSTOPERATIVE DIAGNOSIS: Osteoarthritis of the Left  hip.   PROCEDURE: Left total hip arthroplasty, anterior approach.   SURGEON: Gaynelle Arabian, MD   ASSISTANT: Arlee Muslim, PA-C  ANESTHESIA:  General  ESTIMATED BLOOD LOSS:-* No blood loss amount entered *    DRAINS: Hemovac x1.   COMPLICATIONS: None   CONDITION: PACU - hemodynamically stable.   BRIEF CLINICAL NOTE: Spencer Cunningham is a 58 y.o. male who has advanced end-  stage arthritis of his Left  hip with progressively worsening pain and  dysfunction.The patient has failed nonoperative management and presents for  total hip arthroplasty.   PROCEDURE IN DETAIL: After successful administration of spinal  anesthetic, the traction boots for the [facility] bed were placed on both  feet and the patient was placed onto the [facility] bed, boots placed into the leg  holders. The Left hip was then isolated from the perineum with plastic  drapes and prepped and draped in the usual sterile fashion. ASIS and  greater trochanter were marked and a oblique incision was made, starting  at about 1 cm lateral and 2 cm distal to the ASIS and coursing towards  the anterior cortex of the femur. The skin was cut with a 10 blade  through subcutaneous tissue to the level of the fascia overlying the  tensor fascia lata muscle. The fascia was then incised in line with the  incision at the junction of the anterior third and posterior 2/3rd. The  muscle was teased off the fascia and then the interval between the TFL  and the rectus was developed. The Hohmann retractor was then placed at  the top of the femoral neck over the capsule. The vessels overlying the  capsule were cauterized and the fat on top of the capsule was removed.  A Hohmann retractor was then placed anterior underneath the rectus  femoris to give exposure to the entire anterior capsule. A T-shaped  capsulotomy  was performed. The edges were tagged and the femoral head  was identified.       Osteophytes are removed off the superior acetabulum.  The femoral neck was then cut in situ with an oscillating saw. Traction  was then applied to the left lower extremity utilizing the [facility]  traction. The femoral head was then removed. Retractors were placed  around the acetabulum and then circumferential removal of the labrum was  performed. Osteophytes were also removed. Reaming starts at 47 mm to  medialize and  Increased in 2 mm increments to 53 mm. We reamed in  approximately 40 degrees of abduction, 20 degrees anteversion. A 54 mm  pinnacle acetabular shell was then impacted in anatomic position under  fluoroscopic guidance with excellent purchase. We did not need to place  any additional dome screws. A 36 mm neutral + 4 marathon liner was then  placed into the acetabular shell.       The femoral lift was then placed along the lateral aspect of the femur  just distal to the vastus ridge. The leg was  externally rotated and capsule  was stripped off the inferior aspect of the femoral neck down to the  level of the lesser trochanter, this was done with electrocautery. The femur was lifted after this was performed. The  leg was then placed and extended in adducted position to essentially delivering the femur. We also removed the capsule superiorly and the  piriformis from the piriformis fossa to gain excellent exposure of the  proximal femur. Rongeur was used to remove some cancellous bone to get  into the lateral portion of the proximal femur for placement of the  initial starter reamer. The starter broaches was placed  the starter broach  and was shown to go down the center of the canal. Broaching  with the  Corail system was then performed starting at size 8, coursing  Up to size 12. A size 12 had excellent torsional and rotational  and axial stability. The trial standard offset neck was then placed   with a 36 + 5 trial head. The hip was then reduced. We confirmed that  the stem was in the canal both on AP and lateral x-rays. It also has excellent sizing. The hip was reduced with outstanding stability through full extension, full external rotation,  and then flexion in adduction internal rotation. AP pelvis was taken  and the leg lengths were measured and found to be exactly equal. Hip  was then dislocated again and the femoral head and neck removed. The  femoral broach was removed. Size 12 Corail stem with a standard offset  neck was then impacted into the femur following native anteversion. Has  excellent purchase in the canal. Excellent torsional and rotational and  axial stability. It is confirmed to be in the canal on AP and lateral  fluoroscopic views. The 36 + 5 ceramic head was placed and the hip  reduced with outstanding stability. Again AP pelvis was taken and it  confirmed that the leg lengths were equal. The wound was then copiously  irrigated with saline solution and the capsule reattached and repaired  with Ethibond suture.  20 mL of Exparel mixed with 50 mL of saline then additional 20 ml of .25% Bupivicaine injected into the capsule and into the edge of the tensor fascia lata as well as subcutaneous tissue. The fascia overlying the tensor fascia lata was  then closed with a running #1 V-Loc. Subcu was closed with interrupted  2-0 Vicryl and subcuticular running 4-0 Monocryl. Incision was cleaned  and dried. Steri-Strips and a bulky sterile dressing applied. Hemovac  drain was hooked to suction and then he was awakened and transported to  recovery in stable condition.        Please note that a surgical assistant was a medical necessity for this procedure to perform it in a safe and expeditious manner. Assistant was necessary to provide appropriate retraction of vital neurovascular structures and to prevent femoral fracture and allow for anatomic placement of the  prosthesis.  Gaynelle Arabian, M.D.

## 2013-10-17 NOTE — Interval H&P Note (Signed)
History and Physical Interval Note:  10/17/2013 7:10 AM  Spencer Cunningham  has presented today for surgery, with the diagnosis of osteoarthritis of the left hip  The various methods of treatment have been discussed with the patient and family. After consideration of risks, benefits and other options for treatment, the patient has consented to  Procedure(s): LEFT TOTAL HIP ARTHROPLASTY ANTERIOR APPROACH (Left) as a surgical intervention .  The patient's history has been reviewed, patient examined, no change in status, stable for surgery.  I have reviewed the patient's chart and labs.  Questions were answered to the patient's satisfaction.     Gearlean Alf

## 2013-10-17 NOTE — H&P (View-Only) (Signed)
Spencer Cunningham. [name] DOB: Jul 13, 1955 Married / Language: English / Race: White Male  Date of Admission:  10-17-2013  Chief Complaint:  Left Hip Pain  History of Present Illness The patient is a 58 year old male who comes in for a preoperative History and Physical. The patient is scheduled for a left total hip arthroplasty (anterior approach) to be performed by Dr. Dione Plover. Aluisio, MD at [facility] on 10-18-2014. The patient comes in today 9 years (1/2) out from right total hip arthroplasty. The patient states that he is doing very well on his right side. The patient reports left hip problems including pain symptoms that have been present for 1 year(s). The symptoms began without any known injury (Patient states that he has been having pain in his groin for over a year now. He said that the pain is constant and hurts with rest as well. He is not taking anything for pain and states that he is needing a replacement.).The patient feels as if their symptoms are does feel they are worsening. He has been having pain over the past year which has been progressive. Pain will radiate but mostly in the groin. It has become more debilitating and more unstable.Spencer Cunningham He can walk in a straight line but any twisting or turning is almost impossible. Lost motion in the hip and more difficult getting dress, going up and down steps, and getting in and our of the car. He knows that he is way past due for getting the hip replaced. He feels that he is much further that when he has the opposite hip done. He doesn't have a lot of pain at rest but pain at night and definitely weight bearing pain. He ia anxious about getting the hip replaced. He has be delayed coming in due to being diagnosed with throat cancer last year. He has completed his chemo and radiation and has had two consequtive clean PET scans and has been given the go ahead for hip surgery. Hip has been bothering him at all times. He would have had it  done sooner it was not for the issues with the throat cancer. He also had a pulmonary embolus. He has a history of hypercoagulable state. He is currently on long term Xarelto for that. He feels as though the hip is limiting his ability to do things he desires. It has really affected his lifestyle in a negative way. he is now ready to proceed with surgery on the left hip at this time. They have been treated conservatively in the past for the above stated problem and despite conservative measures, they continue to have progressive pain and severe functional limitations and dysfunction. They have failed non-operative management including home exercise, medications. It is felt that they would benefit from undergoing total joint replacement. Risks and benefits of the procedure have been discussed with the patient and they elect to proceed with surgery. There are no active contraindications to surgery such as ongoing infection or rapidly progressive neurological disease.   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 Tonsillar Pulmonary Embolism. treated with Xarelto Asthma Blood Clot. Right Leg Depression   Family History Depression. Paternal Grandfather. Heart Disease. Paternal Grandfather. Osteoarthritis. Mother. Rheumatoid Arthritis. Maternal Grandfather.    Social History Exercise. Exercises weekly; does running / walking and gym / weights Living situation. live with spouse Marital status. married Current work status. retired Careers information officer. 3 Tobacco / smoke  exposure. 07/10/2013: no Tobacco use. Former smoker. 07/10/2013 Number of flights of stairs before winded. 2-3 Never consumed alcohol. 07/10/2013: Never consumed alcohol No history of drug/alcohol rehab Not under pain contract Advance Directives. Living Will, Healthcare POA    Medication  History Xarelto (20MG  Tablet, Oral) Active. Simvastatin (40MG  Tablet, Oral) Active. Cymbalta ( Oral) Specific dose unknown - Active. (90mg ) Cevimeline HCl (30MG  Capsule, Oral) Active.    Past Surgical History Rotator Cuff Repair. right Spinal Surgery Total Hip Replacement. right Neck Disc Surgery   Review of Systems General:Not Present- Chills, Fever, Night Sweats, Fatigue, Weight Gain, Weight Loss and Memory Loss. Skin:Not Present- Hives, Itching, Rash, Eczema and Lesions. HEENT:Not Present- Tinnitus, Headache, Double Vision, Visual Loss, Hearing Loss and Dentures. Respiratory:Not Present- Shortness of breath with exertion, Shortness of breath at rest, Allergies, Coughing up blood and Chronic Cough. Cardiovascular:Not Present- Chest Pain, Racing/skipping heartbeats, Difficulty Breathing Lying Down, Murmur, Swelling and Palpitations. Gastrointestinal:Not Present- Bloody Stool, Heartburn, Abdominal Pain, Vomiting, Nausea, Constipation, Diarrhea, Difficulty Swallowing, Jaundice and Loss of appetitie. Male Genitourinary:Present- Urinary frequency, Weak urinary stream and Urinating at Night. Not Present- Blood in Urine, Discharge, Flank Pain, Incontinence, Painful Urination, Urgency and Urinary Retention. Musculoskeletal:Present- Joint Pain. Not Present- Muscle Weakness, Muscle Pain, Joint Swelling, Back Pain, Morning Stiffness and Spasms. Neurological:Not Present- Tremor, Dizziness, Blackout spells, Paralysis, Difficulty with balance and Weakness. Psychiatric:Not Present- Insomnia.    Vitals Weight: 176 lb Height: 68 in Weight was reported by patient. Height was reported by patient. Body Surface Area: 1.96 m Body Mass Index: 26.76 kg/m Resp.: 14 (Unlabored) BP: 112/68 (Sitting, Right Arm, Standard)     Physical Exam The physical exam findings are as follows:   General Mental Status - Alert, cooperative and good historian. General  Appearance- pleasant. Not in acute distress. Orientation- Oriented X3. Build & Nutrition- Well nourished and Well developed.   Head and Neck Head- normocephalic, atraumatic . Neck Global Assessment- supple. no bruit auscultated on the right and no bruit auscultated on the left.   Eye Pupil- Bilateral- Regular and Round. Motion- Bilateral- EOMI.   Chest and Lung Exam Auscultation: Breath sounds:- clear at anterior chest wall and - clear at posterior chest wall. Adventitious sounds:- No Adventitious sounds.   Cardiovascular Auscultation:Rhythm- Regular rate and rhythm. Heart Sounds- S1 WNL and S2 WNL. Murmurs & Other Heart Sounds:Auscultation of the heart reveals - No Murmurs.   Abdomen Palpation/Percussion:Tenderness- Abdomen is non-tender to palpation. Rigidity (guarding)- Abdomen is soft. Auscultation:Auscultation of the abdomen reveals - Bowel sounds normal.   Male Genitourinary  Not done, not pertinent to present illness  Musculoskeletal Musculoskeletal: His right hip can be flexed to 120, rotated in 30, out 40, abduct 40 without discomfort. Left hip flexion 95. No internal rotation, about 10 external rotation, 10 abduction. He has normal pulses and sensation in there. Motor is intact.  X-RAYS: AP pelvis, lateral left hip show bone on bone arthritis of the left hip with subchondral cystic formation with large superolateral osteophytes.   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 Wynelle Link.  Plan is to go home.  PCP - Dr. Garner Nash - Patient has been seen preoperatively and felt to be stable for surgery.  The patient will not receive TXA (tranexamic acid) due to: History of PE, Cancer History  Signed electronically by Joelene Millin, III PA-C

## 2013-10-17 NOTE — Progress Notes (Signed)
Day shift RN reported that pt had a syncope episode and bp systolic 64 after given 2mg  morphine. Pt home medication Evoxac  Can cause hypotension explain this potential side effect to pt and he decided that he would hold off medication while hospitalized. Called pharmacy to inform of the incident and told of pt request the pharmacy verified that this medication can cause hypotension. Will continue to monitor. Arthor Captain LPN

## 2013-10-17 NOTE — Progress Notes (Signed)
Report given to [facility] on 5n, patient stable and transported by PACU techs.

## 2013-10-17 NOTE — Transfer of Care (Signed)
Immediate Anesthesia Transfer of Care Note  Patient: Spencer Cunningham  Procedure(s) Performed: Procedure(s): LEFT TOTAL HIP ARTHROPLASTY ANTERIOR APPROACH (Left)  Patient Location: PACU  Anesthesia Type:General  Level of Consciousness: awake, oriented, patient cooperative and responds to stimulation  Airway & Oxygen Therapy: Patient Spontanous Breathing and Patient connected to nasal cannula oxygen  Post-op Assessment: Report given to PACU RN, Post -op Vital signs reviewed and stable and Patient moving all extremities X 4  Post vital signs: Reviewed and stable  Complications: No apparent anesthesia complications

## 2013-10-17 NOTE — Progress Notes (Signed)
Utilization review completed.  

## 2013-10-17 NOTE — Anesthesia Preprocedure Evaluation (Addendum)
Anesthesia Evaluation  Patient identified by MRN, date of birth, ID band Patient awake    Reviewed: Allergy & Precautions, H&P , NPO status , Patient's Chart, lab work & pertinent test results  Airway Mallampati: I TM Distance: >3 FB Neck ROM: Full    Dental   Pulmonary asthma , former smoker,          Cardiovascular + Peripheral Vascular Disease     Neuro/Psych Depression    GI/Hepatic   Endo/Other    Renal/GU Renal InsufficiencyRenal disease     Musculoskeletal   Abdominal   Peds  Hematology   Anesthesia Other Findings   Reproductive/Obstetrics                          Anesthesia Physical Anesthesia Plan  ASA: II  Anesthesia Plan: General   Post-op Pain Management:    Induction: Intravenous  Airway Management Planned: Oral ETT  Additional Equipment:   Intra-op Plan:   Post-operative Plan: Extubation in OR  Informed Consent: I have reviewed the patients History and Physical, chart, labs and discussed the procedure including the risks, benefits and alternatives for the proposed anesthesia with the patient or authorized representative who has indicated his/her understanding and acceptance.     Plan Discussed with: CRNA and Surgeon  Anesthesia Plan Comments:        Anesthesia Quick Evaluation

## 2013-10-18 DIAGNOSIS — D62 Acute posthemorrhagic anemia: Secondary | ICD-10-CM | POA: Diagnosis not present

## 2013-10-18 LAB — BASIC METABOLIC PANEL
BUN: 14 mg/dL (ref 6–23)
CHLORIDE: 104 meq/L (ref 96–112)
CO2: 25 mEq/L (ref 19–32)
Calcium: 8.6 mg/dL (ref 8.4–10.5)
Creatinine, Ser: 0.68 mg/dL (ref 0.50–1.35)
GFR calc non Af Amer: >90 mL/min (ref >90)
Glucose, Bld: 112 mg/dL — ABNORMAL HIGH (ref 70–99)
POTASSIUM: 3.9 meq/L (ref 3.7–5.3)
SODIUM: 141 meq/L (ref 137–147)

## 2013-10-18 LAB — CBC
HCT: 32.7 % — ABNORMAL LOW (ref 39.0–52.0)
Hemoglobin: 11.2 g/dL — ABNORMAL LOW (ref 13.0–17.0)
MCH: 32.7 pg (ref 26.0–34.0)
MCHC: 34.3 g/dL (ref 30.0–36.0)
MCV: 95.3 fL (ref 78.0–100.0)
PLATELETS: 179 10*3/uL (ref 150–400)
RBC: 3.43 MIL/uL — ABNORMAL LOW (ref 4.22–5.81)
RDW: 13.1 % (ref 11.5–15.5)
WBC: 11.7 10*3/uL — AB (ref 4.0–10.5)

## 2013-10-18 MED ORDER — DEXTROSE-NACL 5-0.45 % IV SOLN: INTRAVENOUS | Status: AC

## 2013-10-18 MED ORDER — DULOXETINE HCL 60 MG PO CPEP
90.0000 mg | ORAL_CAPSULE | Freq: Every day | ORAL | Status: DC
  Administered 2013-10-19: 90 mg via ORAL
  Filled 2013-10-18 (×2): qty 1

## 2013-10-18 MED ORDER — SODIUM CHLORIDE 0.9 % IV BOLUS (SEPSIS)
500.0000 mL | Freq: Once | INTRAVENOUS | Status: AC
  Administered 2013-10-18: 500 mL via INTRAVENOUS

## 2013-10-18 NOTE — Progress Notes (Signed)
   Subjective: 1 Day Post-Op Procedure(s) (LRB): LEFT TOTAL HIP ARTHROPLASTY ANTERIOR APPROACH (Left) Patient reports pain as mild.   Patient seen in rounds for Dr. Wynelle Link.  Has a presyncopal episode yesterday with his low pressures.  Will give extra fluids today.  Will keep today and plan on home tomorrow as long as he does well with therapy. Patient is well, but has had some minor complaints of pain in the hip, requiring pain medications Attempted therapy yesterday.  Will resume today. Plan is to go Home after hospital stay.  Objective: Vital signs in last 24 hours: Temp:  [96.2 F (35.7 C)-98.7 F (37.1 C)] 98.3 F (36.8 C) (06/27 0558) Pulse Rate:  [51-71] 63 (06/27 0558) Resp:  [9-16] 16 (06/27 0558) BP: (69-128)/(46-100) 106/55 mmHg (06/27 0558) SpO2:  [92 %-100 %] 96 % (06/27 0558)  Intake/Output from previous day:  Intake/Output Summary (Last 24 hours) at 10/18/13 0808 Last data filed at 10/18/13 0700  Gross per 24 hour  Intake 3288.33 ml  Output   4300 ml  Net -1011.67 ml    Intake/Output this shift: UOP 1550 since MN -1111 on totals  Labs:  Recent Labs  10/18/13 0505  HGB 11.2*    Recent Labs  10/18/13 0505  WBC 11.7*  RBC 3.43*  HCT 32.7*  PLT 179    Recent Labs  10/18/13 0505  NA 141  K 3.9  CL 104  CO2 25  BUN 14  CREATININE 0.68  GLUCOSE 112*  CALCIUM 8.6   No results found for this basename: LABPT, INR,  in the last 72 hours  EXAM General - Patient is Alert, Appropriate and Oriented Extremity - Neurovascular intact Sensation intact distally Dorsiflexion/Plantar flexion intact Dressing - dressing C/D/I Motor Function - intact, moving foot and toes well on exam.  Hemovac pulled without difficulty.  Past Medical History  Diagnosis Date  . Peripheral vascular disease   . Pulmonary embolism 2014/March    all care has been in [facility], treated first /w Heparin , then Coumadin & now Xarelto    . Asthma     as child , related to  enviromental allergies  . Depression   . Chronic kidney disease     passed renal calculi  h/o , passed spontaneously  . Arthritis     OA- "everywhere"   . Cancer     tonsil    Assessment/Plan: 1 Day Post-Op Procedure(s) (LRB): LEFT TOTAL HIP ARTHROPLASTY ANTERIOR APPROACH (Left) Principal Problem:   OA (osteoarthritis) of hip Active Problems:   Postoperative anemia due to acute blood loss  Estimated body mass index is 27.53 kg/(m^2) as calculated from the following:   Height as of 10/07/13: 5\' 8"  (1.727 m).   Weight as of this encounter: 82.101 kg (181 lb). Advance diet Up with therapy Plan for discharge tomorrow Discharge home with home health Fluids this morning.  500 bolus a nf then 125 cc/hr for two hours and then KVO fluids.  Recheck BP after the bolus.  DVT Prophylaxis - Xarelto 10 mg for now but he will resume his 20 mg tablet once he gets home. Weight Bearing As Tolerated left Leg Hemovac Pulled Begin Therapy Set up discharge and Rx's today for anticipated discharge tomorrow.  Arlee Muslim, PA-C Orthopaedic Surgery 10/18/2013, 8:08 AM

## 2013-10-18 NOTE — Progress Notes (Signed)
PT Cancellation Note  Patient Details Name: Spencer Cunningham MRN: [id number] DOB: 24-Jan-1956   Cancelled Treatment:    Reason Eval/Treat Not Completed: Patient declined, no reason specified Pt reports just returning to bed and is feeling pretty tired from previous therapy session. Was given an exercise handout and reviewed these briefly. Will follow up in AM for any further questions/concerns.  Previous PT session went remarkably well and anticipate he will be ready to go home early tomorrow from a mobility standpoint when he is medically ready.  [place], [place]   Ellouise Newer 10/18/2013, 4:08 PM

## 2013-10-18 NOTE — Evaluation (Addendum)
Occupational Therapy Evaluation Patient Details Name: Spencer Cunningham MRN: [id number] DOB: [birth year] Today's Date: 10/18/2013    History of Present Illness Pt admitted for Left direct anterior THA with hx of prior R posterior THA 9 yrs ago   Clinical Impression   Pt moving well during session. Education provided during session and feel pt is safe to d/c home from OT standpoint.     Follow Up Recommendations  No OT follow up    Equipment Recommendations  None recommended by OT    Recommendations for Other Services       Precautions / Restrictions Precautions Precautions: Fall Precaution Comments: no precaution, direct anterior Restrictions Weight Bearing Restrictions: Yes LLE Weight Bearing: Weight bearing as tolerated      Mobility Bed Mobility Overal bed mobility: Modified Independent                Transfers Overall transfer level: Needs assistance   Transfers: Sit to/from Stand Sit to Stand: Supervision              Balance                                            ADL Overall ADL's : Needs assistance/impaired             Lower Body Bathing: Sit to/from stand;Min guard       Lower Body Dressing: Minimal assistance;With adaptive equipment;Sit to/from stand   Toilet Transfer: Supervision/safety;Ambulation;RW (cane-used both RW and cane)       Tub/ Shower Transfer: Supervision/safety;Ambulation (cane; practiced stepping in)   Functional mobility during ADLs: Supervision/safety (ambulated with RW and cane) General ADL Comments: Educated on dressing technique. Pt has sockaid and knows how to use it so did not practice. Educated on safety tips for home (shoes, sitting for most of LB ADLs, rugs, having pet put up until pt sits down). Recommended wife be with him for shower transfer. Ambulated with walker and cane. Discussed use of reacher for picking up objects-pt appeared to reach far enough for LB dressing but required  assistance to don sock. Practiced shower transfer. Discussed how pt did not have hip precautions. Discussed use of bag on walker (pt may use walker later due to pain). Discussed shower chair options.      Vision                     Perception     Praxis      Pertinent Vitals/Pain Reports pain when trying to don sock. OT assisted. Increased activity during session.     Hand Dominance     Extremity/Trunk Assessment Upper Extremity Assessment Upper Extremity Assessment: Overall WFL for tasks assessed   Lower Extremity Assessment Lower Extremity Assessment: Defer to PT evaluation       Communication Communication Communication: No difficulties   Cognition Arousal/Alertness: Awake/alert Behavior During Therapy: WFL for tasks assessed/performed Overall Cognitive Status: Within Functional Limits for tasks assessed                     General Comments       Exercises       Shoulder Instructions      Home Living Family/patient expects to be discharged to:: Private residence Living Arrangements: Spouse/significant other Available Help at Discharge: Family Type of Home: House Home Access: Stairs to enter [employer] of  Steps: 4   Home Layout: One level     Bathroom Shower/Tub: Walk-in shower;Tub/shower unit   Bathroom Toilet:  (has BSC)     Home Equipment: Walker - 2 wheels;Crutches;Bedside commode          Prior Functioning/Environment Level of Independence: Independent             OT Diagnosis:     OT Problem List:     OT Treatment/Interventions:      OT Goals(Current goals can be found in the care plan section)    OT Frequency:     Barriers to D/C:            Co-evaluation              End of Session Equipment Utilized During Treatment: Rolling walker;Other (comment) (cane)  Activity Tolerance: Patient tolerated treatment well Patient left: in chair;with call bell/phone within reach;with family/visitor  present   Time: 7001-7494 OT Time Calculation (min): 21 min Charges:  OT General Charges $OT Visit: 1 Procedure OT Evaluation $Initial OT Evaluation Tier I: 1 Procedure OT Treatments $Self Care/Home Management : 8-22 mins G-CodesBenito Mccreedy OTR/L [phone] 10/18/2013, 1:17 PM

## 2013-10-18 NOTE — Discharge Instructions (Signed)
°Dr. Frank Aluisio °Total Joint Specialist °[facility] °[address] °[place], [place] [address] °[phone] ° ° ° °ANTERIOR APPROACH TOTAL HIP REPLACEMENT POSTOPERATIVE DIRECTIONS ° ° °Hip Rehabilitation, Guidelines Following Surgery  °The results of a hip operation are greatly improved after range of motion and muscle strengthening exercises. Follow all safety measures which are given to protect your hip. If any of these exercises cause increased pain or swelling in your joint, decrease the amount until you are comfortable again. Then slowly increase the exercises. Call your caregiver if you have problems or questions.  °HOME CARE INSTRUCTIONS  °Most of the following instructions are designed to prevent the dislocation of your new hip.  °Remove items at home which could result in a fall. This includes throw rugs or furniture in walking pathways.  °Continue medications as instructed at time of discharge. °· You may have some home medications which will be placed on hold until you complete the course of blood thinner medication. °· You may start showering once you are discharged home but do not submerge the incision under water. Just pat the incision dry and apply a dry gauze dressing on daily. °Do not put on socks or shoes without following the instructions of your caregivers.  °Sit on high chairs which makes it easier to stand.  °Sit on chairs with arms. Use the chair arms to help push yourself up when arising.  °Keep your leg on the side of the operation out in front of you when standing up.  °Arrange for the use of a toilet seat elevator so you are not sitting low.   °· Walk with walker as instructed.  °You may resume a sexual relationship in one month or when given the OK by your caregiver.  °Use walker as long as suggested by your caregivers.  °You may put full weight on your legs and walk as much as is comfortable. °Avoid periods of inactivity such as sitting longer than an hour  when not asleep. This helps prevent blood clots.  °You may return to work once you are cleared by your surgeon.  °Do not drive a car for 6 weeks or until released by your surgeon.  °Do not drive while taking narcotics.  °Wear elastic stockings for three weeks following surgery during the day but you may remove then at night.  °Make sure you keep all of your appointments after your operation with all of your doctors and caregivers. You should call the office at the above phone number and make an appointment for approximately two weeks after the date of your surgery. °Change the dressing daily and reapply a dry dressing each time. °Please pick up a stool softener and laxative for home use as long as you are requiring pain medications. °· Continue to use ice on the hip for pain and swelling from surgery. You may notice swelling that will progress down to the foot and ankle.  This is normal after  surgery.  Elevate the leg when you are not up walking on it.   °It is important for you to complete the blood thinner medication as prescribed by your doctor. °· Continue to use the breathing machine which will help keep your temperature down.  It is common for your temperature to cycle up and down following surgery, especially at night when you are not up moving around and exerting yourself.  The breathing machine keeps your lungs expanded and your temperature down. ° °RANGE OF MOTION AND STRENGTHENING EXERCISES  °  These exercises are designed to help you keep full movement of your hip joint. Follow your caregiver's or physical therapist's instructions. Perform all exercises about fifteen times, three times per day or as directed. Exercise both hips, even if you have had only one joint replacement. These exercises can be done on a training (exercise) mat, on the floor, on a table or on a bed. Use whatever works the best and is most comfortable for you. Use music or television while you are exercising so that the exercises are  a pleasant break in your day. This will make your life better with the exercises acting as a break in routine you can look forward to.  Lying on your back, slowly slide your foot toward your buttocks, raising your knee up off the floor. Then slowly slide your foot back down until your leg is straight again.  Lying on your back spread your legs as far apart as you can without causing discomfort.  Lying on your side, raise your upper leg and foot straight up from the floor as far as is comfortable. Slowly lower the leg and repeat.  Lying on your back, tighten up the muscle in the front of your thigh (quadriceps muscles). You can do this by keeping your leg straight and trying to raise your heel off the floor. This helps strengthen the largest muscle supporting your knee.  Lying on your back, tighten up the muscles of your buttocks both with the legs straight and with the knee bent at a comfortable angle while keeping your heel on the floor.   SKILLED REHAB INSTRUCTIONS: If the patient is transferred to a skilled rehab facility following release from the hospital, a list of the current medications will be sent to the facility for the patient to continue.  When discharged from the skilled rehab facility, please have the facility set up the patient's [place] prior to being released. Also, the skilled facility will be responsible for providing the patient with their medications at time of release from the facility to include their pain medication, the muscle relaxants, and their blood thinner medication. If the patient is still at the rehab facility at time of the two week follow up appointment, the skilled rehab facility will also need to assist the patient in arranging follow up appointment in our office and any transportation needs.  MAKE SURE YOU:  Understand these instructions.  Will watch your condition.  Will get help right away if you are not doing well or get worse.  Pick up  stool softner and laxative for home. Do not submerge incision under water. May shower. Continue to use ice for pain and swelling from surgery. Total Hip Protocol.  Patient will resume his Xarelto 20 mg dosing once he is discharged home.

## 2013-10-18 NOTE — Discharge Summary (Signed)
Physician Discharge Summary   Patient ID: Spencer Cunningham MRN: [id number] DOB/AGE: 1956/02/14 58 y.o.  Admit date: 10/17/2013 Discharge date: 10/19/2013  Primary Diagnosis:  Osteoarthritis of the Left hip.  Admission Diagnoses:  Past Medical History  Diagnosis Date  . Peripheral vascular disease   . Pulmonary embolism 2014/March    all care has been in [facility], treated first /w Heparin , then Coumadin & now Xarelto    . Asthma     as child , related to enviromental allergies  . Depression   . Chronic kidney disease     passed renal calculi  h/o , passed spontaneously  . Arthritis     OA- "everywhere"   . Cancer     tonsil   Discharge Diagnoses:   Principal Problem:   OA (osteoarthritis) of hip Active Problems:   Postoperative anemia due to acute blood loss  Estimated body mass index is 27.53 kg/(m^2) as calculated from the following:   Height as of 10/07/13: _0  (1.727 m).   Weight as of this encounter: 82.101 kg (181 lb).  Procedure(s) (LRB): LEFT TOTAL HIP ARTHROPLASTY ANTERIOR APPROACH (Left)   Consults: None  HPI: [name] is a 58 y.o. male who has advanced end-  stage arthritis of his Left hip with progressively worsening pain and  dysfunction.The patient has failed nonoperative management and presents for  total hip arthroplasty.   Laboratory Data: Admission on 10/17/2013, Discharged on 10/19/2013  Component Date Value Ref Range Status  . WBC 10/18/2013 11.7* 4.0 - 10.5 K/uL Final  . RBC 10/18/2013 3.43* 4.22 - 5.81 MIL/uL Final  . Hemoglobin 10/18/2013 11.2* 13.0 - 17.0 g/dL Final  . HCT 10/18/2013 32.7* 39.0 - 52.0 % Final  . MCV 10/18/2013 95.3  78.0 - 100.0 fL Final  . MCH 10/18/2013 32.7  26.0 - 34.0 pg Final  . MCHC 10/18/2013 34.3  30.0 - 36.0 g/dL Final  . RDW 10/18/2013 13.1  11.5 - 15.5 % Final  . Platelets 10/18/2013 179  150 - 400 K/uL Final  . Sodium 10/18/2013 141  137 - 147 mEq/L Final  . Potassium 10/18/2013 3.9  3.7 - 5.3 mEq/L Final  .  Chloride 10/18/2013 104  96 - 112 mEq/L Final  . CO2 10/18/2013 25  19 - 32 mEq/L Final  . Glucose, Bld 10/18/2013 112* 70 - 99 mg/dL Final  . BUN 10/18/2013 14  6 - 23 mg/dL Final  . Creatinine, Ser 10/18/2013 0.68  0.50 - 1.35 mg/dL Final  . Calcium 10/18/2013 8.6  8.4 - 10.5 mg/dL Final  . GFR calc non Af Amer 10/18/2013 >90  >90 mL/min Final  . GFR calc Af Amer 10/18/2013 >90  >90 mL/min Final   Comment: (NOTE)                          The eGFR has been calculated using the CKD EPI equation.                          This calculation has not been validated in all clinical situations.                          eGFR's persistently <90 mL/min signify possible Chronic Kidney                          Disease.  [name]  WBC 10/19/2013 9.9  4.0 - 10.5 K/uL Final  . RBC 10/19/2013 3.08* 4.22 - 5.81 MIL/uL Final  . Hemoglobin 10/19/2013 10.2* 13.0 - 17.0 g/dL Final  . HCT 10/19/2013 29.7* 39.0 - 52.0 % Final  . MCV 10/19/2013 96.4  78.0 - 100.0 fL Final  . MCH 10/19/2013 33.1  26.0 - 34.0 pg Final  . MCHC 10/19/2013 34.3  30.0 - 36.0 g/dL Final  . RDW 10/19/2013 13.3  11.5 - 15.5 % Final  . Platelets 10/19/2013 150  150 - 400 K/uL Final  . Sodium 10/19/2013 140  137 - 147 mEq/L Final  . Potassium 10/19/2013 4.0  3.7 - 5.3 mEq/L Final  . Chloride 10/19/2013 104  96 - 112 mEq/L Final  . CO2 10/19/2013 26  19 - 32 mEq/L Final  . Glucose, Bld 10/19/2013 104* 70 - 99 mg/dL Final  . BUN 10/19/2013 17  6 - 23 mg/dL Final  . Creatinine, Ser 10/19/2013 0.64  0.50 - 1.35 mg/dL Final  . Calcium 10/19/2013 8.2* 8.4 - 10.5 mg/dL Final  . GFR calc non Af Amer 10/19/2013 >90  >90 mL/min Final  . GFR calc Af Amer 10/19/2013 >90  >90 mL/min Final   Comment: (NOTE)                          The eGFR has been calculated using the CKD EPI equation.                          This calculation has not been validated in all clinical situations.                          eGFR's persistently <90 mL/min signify possible  Chronic Kidney                          Disease.  Hospital Outpatient Visit on 10/07/2013  Component Date Value Ref Range Status  . aPTT 10/07/2013 30  24 - 37 seconds Final  . WBC 10/07/2013 5.0  4.0 - 10.5 K/uL Final  . RBC 10/07/2013 4.49  4.22 - 5.81 MIL/uL Final  . Hemoglobin 10/07/2013 14.9  13.0 - 17.0 g/dL Final  . HCT 10/07/2013 43.7  39.0 - 52.0 % Final  . MCV 10/07/2013 97.3  78.0 - 100.0 fL Final  . MCH 10/07/2013 33.2  26.0 - 34.0 pg Final  . MCHC 10/07/2013 34.1  30.0 - 36.0 g/dL Final  . RDW 10/07/2013 13.3  11.5 - 15.5 % Final  . Platelets 10/07/2013 188  150 - 400 K/uL Final  . Sodium 10/07/2013 144  137 - 147 mEq/L Final  . Potassium 10/07/2013 4.5  3.7 - 5.3 mEq/L Final  . Chloride 10/07/2013 105  96 - 112 mEq/L Final  . CO2 10/07/2013 29  19 - 32 mEq/L Final  . Glucose, Bld 10/07/2013 81  70 - 99 mg/dL Final  . BUN 10/07/2013 24* 6 - 23 mg/dL Final  . Creatinine, Ser 10/07/2013 0.81  0.50 - 1.35 mg/dL Final  . Calcium 10/07/2013 9.4  8.4 - 10.5 mg/dL Final  . Total Protein 10/07/2013 6.7  6.0 - 8.3 g/dL Final  . Albumin 10/07/2013 3.5  3.5 - 5.2 g/dL Final  . AST 10/07/2013 18  0 - 37 U/L Final  . ALT 10/07/2013 9  0 - 53 U/L Final  .  Alkaline Phosphatase 10/07/2013 54  39 - 117 U/L Final  . Total Bilirubin 10/07/2013 0.4  0.3 - 1.2 mg/dL Final  . GFR calc non Af Amer 10/07/2013 >90  >90 mL/min Final  . GFR calc Af Amer 10/07/2013 >90  >90 mL/min Final   Comment: (NOTE)                          The eGFR has been calculated using the CKD EPI equation.                          This calculation has not been validated in all clinical situations.                          eGFR's persistently <90 mL/min signify possible Chronic Kidney                          Disease.  [name] Prothrombin Time 10/07/2013 16.7* 11.6 - 15.2 seconds Final  . INR 10/07/2013 1.39  0.00 - 1.49 Final  . ABO/RH(D) 10/07/2013 A POS   Final  . Antibody Screen 10/07/2013 NEG   Final  . Sample  Expiration 10/07/2013 10/21/2013   Final  . Color, Urine 10/07/2013 YELLOW  YELLOW Final  . APPearance 10/07/2013 CLEAR  CLEAR Final  . Specific Gravity, Urine 10/07/2013 1.027  1.005 - 1.030 Final  . pH 10/07/2013 5.0  5.0 - 8.0 Final  . Glucose, UA 10/07/2013 NEGATIVE  NEGATIVE mg/dL Final  . Hgb urine dipstick 10/07/2013 NEGATIVE  NEGATIVE Final  . Bilirubin Urine 10/07/2013 NEGATIVE  NEGATIVE Final  . Ketones, ur 10/07/2013 NEGATIVE  NEGATIVE mg/dL Final  . Protein, ur 10/07/2013 NEGATIVE  NEGATIVE mg/dL Final  . Urobilinogen, UA 10/07/2013 0.2  0.0 - 1.0 mg/dL Final  . Nitrite 10/07/2013 NEGATIVE  NEGATIVE Final  . Leukocytes, UA 10/07/2013 NEGATIVE  NEGATIVE Final   MICROSCOPIC NOT DONE ON URINES WITH NEGATIVE PROTEIN, BLOOD, LEUKOCYTES, NITRITE, OR GLUCOSE <1000 mg/dL.  [name] MRSA, PCR 10/07/2013 NEGATIVE  NEGATIVE Final  . Staphylococcus aureus 10/07/2013 NEGATIVE  NEGATIVE Final   Comment:                                 The Xpert SA Assay (FDA                          approved for NASAL specimens                          in patients over 54 years of age),                          is one component of                          a comprehensive surveillance                          program.  Test performance has                          been validated by [employer] for patients greater                          than or equal to 68 year old.                          It is not intended                          to diagnose infection nor to                          guide or monitor treatment.  . ABO/RH(D) 10/07/2013 A POS   Final     X-Rays:Dg Chest 2 View  10/07/2013   CLINICAL DATA:  Preoperative left hip replacement. History of throat carcinoma  EXAM: CHEST  2 VIEW  COMPARISON:  March 29, 2011  FINDINGS: There is less opacity along the periphery of the left upper hemi thorax and apex compared to the prior study. Subtle asymmetry in this area on the left  compared to the right does remain. There is minimal scarring in the left base. Elsewhere, lungs are clear. The heart size and pulmonary vascularity are normal. No adenopathy. There is degenerative change in the thoracic spine. There is thoracolumbar levoscoliosis.  IMPRESSION: Asymmetric subtle pleural thickening on the left laterally extending toward the apex is much less apparent compared to the prior study from 2012. There is no new pleural thickening or opacity of this nature. Elsewhere lungs are clear except for mild left base scarring. No adenopathy.   Electronically Signed   By: Lowella Grip M.D.   On: 10/07/2013 15:01   Dg Hip Complete Left  10/07/2013   CLINICAL DATA:  Left hip pain for 2 years. pre-op evaluation for arthroplasty.  EXAM: LEFT HIP - COMPLETE 2+ VIEW  COMPARISON:  Pelvic and right hip radiographs 03/21/2004.  FINDINGS: There are mildly progressive changes of left hip osteoarthritis with joint space loss and osteophytes. No acute fracture, dislocation or erosive changes identified. Lower lumbar spondylosis and prior right total hip arthroplasty are noted. There is some heterotopic ossification superior to the right greater trochanter.  IMPRESSION: Mildly progressive changes of left hip osteoarthritis.   Electronically Signed   By: Camie Patience M.D.   On: 10/07/2013 15:04   Dg Hip Operative Right  10/17/2013   CLINICAL DATA:  Osteoarthritis of the left hip.  EXAM: DG OPERATIVE RIGHT HIP  TECHNIQUE: A single spot fluoroscopic AP image of the right hip is submitted.  COMPARISON:  Radiograph 10/07/2013  FINDINGS: AP C-arm images demonstrate left total hip prosthesis in excellent position in the AP projection. No fracture.  IMPRESSION: New left total hip prosthesis appears in excellent position in the AP projection.   Electronically Signed   By: Rozetta Nunnery M.D.   On: 10/17/2013 10:28   Dg Pelvis Portable  10/17/2013   CLINICAL DATA:  Left hip surgery.  EXAM: PORTABLE PELVIS 1-2  VIEWS  COMPARISON:  None.  FINDINGS: Patient status post left hip replacement. Good anatomic alignment . Right hip replacement noted. No acute bony abnormality identified. Degenerative changes lumbar spine.  IMPRESSION: Left hip replacement with good anatomic alignment .   Electronically Signed   By: Marcello Moores  Register   On: 10/17/2013 10:06    EKG: Orders placed during the hospital encounter of 10/07/13  . EKG 12-LEAD  .  EKG 12-LEAD     Hospital Course: Patient was admitted to [facility] and taken to the OR and underwent the above state procedure without complications.  Patient tolerated the procedure well and was later transferred to the recovery room and then to the orthopaedic floor for postoperative care.  They were given PO and IV analgesics for pain control following their surgery.  They were given 24 hours of postoperative antibiotics of      Anti-infectives   Start     Dose/Rate Route Frequency Ordered Stop   10/17/13 1300  ceFAZolin (ANCEF) IVPB 2 g/50 mL premix     2 g 100 mL/hr over 30 Minutes Intravenous Every 6 hours 10/17/13 1033 10/17/13 1837   10/17/13 0600  ceFAZolin (ANCEF) IVPB 2 g/50 mL premix     2 g 100 mL/hr over 30 Minutes Intravenous On call to O.R. 10/16/13 1423 10/17/13 0745     and he was placed back onto his  Xarelto.   The Xarelto was resumed at a lower dose of 10 mg for the hospital stay and was planned to go back to 20 mg at discharge.  PT and OT were ordered for total hip protocol.  The patient was allowed to be WBAT with therapy. Discharge planning was consulted to help with postop disposition and equipment needs.  Patient had a tough night on the evening of surgery.  He had a presyncopal episode the day os surgery with his low pressures. Gave extra fluids on POD 1.  They started to get up OOB with therapy on day one.  Hemovac drain was pulled without difficulty.  Continued to work with therapy into day two.  Dressing was changed on day two and the  incision was healing well.  Patient was seen in rounds by the weekend coverage staff and was ready to go home.  Diet: Cardiac diet Activity:WBAT Follow-up:in 2 weeks Disposition - Home Discharged Condition: good      Medication List    STOP taking these medications       multivitamin with minerals Tabs tablet      TAKE these medications       cevimeline 30 MG capsule  Commonly known as:  EVOXAC  Take 30 mg by mouth 3 (three) times daily.     DULoxetine 30 MG capsule  Commonly known as:  CYMBALTA  Take 90 mg by mouth at bedtime.     methocarbamol 500 MG tablet  Commonly known as:  ROBAXIN  Take 1 tablet (500 mg total) by mouth every 6 (six) hours as needed for muscle spasms.     oxyCODONE 5 MG immediate release tablet  Commonly known as:  Oxy IR/ROXICODONE  Take 1-2 tablets (5-10 mg total) by mouth every 3 (three) hours as needed for breakthrough pain.     rivaroxaban 20 MG Tabs tablet  Commonly known as:  XARELTO  Take 20 mg by mouth daily before breakfast.     simvastatin 40 MG tablet  Commonly known as:  ZOCOR  Take 40 mg by mouth daily before breakfast.     traZODone 50 MG tablet  Commonly known as:  DESYREL  Take 50 mg by mouth at bedtime as needed for sleep.       Follow-up Information   Follow up with Gearlean Alf, MD. Schedule an appointment as soon as possible for a visit on 10/30/2013. (Call [phone] Monday to make the appointment)    Specialty:  Orthopedic Surgery   Contact information:  [address] [place] [address] [phone]       Signed: Arlee Muslim, PA-C Orthopaedic Surgery 11/03/2013, 7:23 AM

## 2013-10-18 NOTE — Care Management Note (Signed)
CARE MANAGEMENT NOTE 10/18/2013  Patient:  Spencer Cunningham, Spencer Cunningham   Account Number:  [id number]  Date Initiated:  10/18/2013  Documentation initiated by:  Ricki Miller  Subjective/Objective Assessment:   58 yr old male s/p left total hip arthroplasty anterior approach.     Action/Plan:   Case manager spoke with patient concerning home health and DME needs at discharge. Patient lives in [facility] permission received to locate [facility]. CM will have to fax orders and confirm [facility] on Monday, will notify patient when arranged.   Anticipated DC Date:  10/19/2013   Anticipated DC Plan:  [place]  CM consult      PAC Choice  [place]   Choice offered to / List presented to:  C-1 Patient   DME arranged  [place]  3-N-1      DME agency  [place] arranged  HH-2 PT      Status of service:  In process, will continue to follow Medicare Important Message given?   (If response is "NO", the following Medicare IM given date fields will be blank) Date Medicare IM given:   Date Additional Medicare IM given:    Discharge Disposition:    Per UR Regulation:  Reviewed for med. necessity/level of care/duration of stay

## 2013-10-18 NOTE — Progress Notes (Signed)
Physical Therapy Treatment Patient Details Name: Spencer Cunningham MRN: [id number] DOB: March 26, 1956 Today's Date: 10/18/2013    History of Present Illness Pt admitted for Left direct anterior THA with hx of prior R posterior THA 9 yrs ago    PT Comments    Patient is progressing very well towards physical therapy goals, ambulating up to 600 feet at supervision level using single point cane. Completed stair training with no further questions and performs this task safely. No symptoms of dizziness or loss of balance throughout therapy session.Feel patient is adequate for d/c from mobility stand point when medically ready. Will continue to follow until d/c.   Follow Up Recommendations  Outpatient PT     Equipment Recommendations  None recommended by PT    Recommendations for Other Services       Precautions / Restrictions Precautions Precautions: Fall Precaution Comments: no precaution, direct anterior Restrictions Weight Bearing Restrictions: Yes LLE Weight Bearing: Weight bearing as tolerated    Mobility  Bed Mobility Overal bed mobility: Modified Independent                Transfers Overall transfer level: Modified independent   Transfers: Sit to/from Stand Sit to Stand: Supervision         General transfer comment: Pt standing upon PT entering room. Educated to only stand with assist due to recent surgery and episode of presyncope yesterday. Verbalizes understanding.  Ambulation/Gait Ambulation/Gait assistance: Supervision Ambulation Distance (Feet): 600 Feet Assistive device: Straight cane Gait Pattern/deviations: Step-through pattern;Decreased stance time - left;Antalgic;Wide base of support   Gait velocity interpretation: Below normal speed for age/gender General Gait Details: Mildly antalgic gait but overall moving generally well. No loss of balance during therapy session. Educated on safe DME use with cane. Trails in both hands, no significant difference.     Stairs Stairs: Yes Stairs assistance: Supervision Stair Management: One rail Right;Step to pattern;Forwards;With cane Number of Stairs: 15 General stair comments: Able to ascend/descend full flight of stairs with cane and use of single rail. Supervision for safety but no instances of loss of balance or buckling. Pt performs this task safely. Educated to use step-to pattern for safety.  Wheelchair Mobility    Modified Rankin (Stroke Patients Only)       Balance                                    Cognition Arousal/Alertness: Awake/alert Behavior During Therapy: WFL for tasks assessed/performed Overall Cognitive Status: Within Functional Limits for tasks assessed                      Exercises Total Joint Exercises Ankle Circles/Pumps: AROM;Both;10 reps;Seated Quad Sets: AROM;Both;10 reps;Seated Short Arc Quad: AROM;Left;10 reps;Seated Heel Slides: AAROM;Left;10 reps;Supine Hip ABduction/ADduction: AAROM;Left;10 reps;Supine Straight Leg Raises: AAROM;Left;10 reps;Supine    General Comments        Pertinent Vitals/Pain Pt reports pain as minimal Patient repositioned in chair for comfort.     Home Living Family/patient expects to be discharged to:: Private residence Living Arrangements: Spouse/significant other Available Help at Discharge: Family Type of Home: House Home Access: Stairs to enter   Home Layout: One level Home Equipment: Environmental consultant - 2 wheels;Crutches;Bedside commode      Prior Function Level of Independence: Independent          PT Goals (current goals can now be found in the care plan section) Acute Rehab  PT Goals PT Goal Formulation: With patient Time For Goal Achievement: 10/24/13 Potential to Achieve Goals: Good Progress towards PT goals: Progressing toward goals    Frequency  7X/week    PT Plan Current plan remains appropriate    Co-evaluation             End of Session   Activity Tolerance: Patient  tolerated treatment well Patient left: in chair;with call bell/phone within reach;with family/visitor present     Time: 1223-1240 PT Time Calculation (min): 17 min  Charges:  $Gait Training: 8-22 mins                    G Codes:      Elayne Snare, [place]  Ellouise Newer 10/18/2013, 2:11 PM

## 2013-10-19 LAB — CBC
HEMATOCRIT: 29.7 % — AB (ref 39.0–52.0)
HEMOGLOBIN: 10.2 g/dL — AB (ref 13.0–17.0)
MCH: 33.1 pg (ref 26.0–34.0)
MCHC: 34.3 g/dL (ref 30.0–36.0)
MCV: 96.4 fL (ref 78.0–100.0)
Platelets: 150 10*3/uL (ref 150–400)
RBC: 3.08 MIL/uL — ABNORMAL LOW (ref 4.22–5.81)
RDW: 13.3 % (ref 11.5–15.5)
WBC: 9.9 10*3/uL (ref 4.0–10.5)

## 2013-10-19 LAB — BASIC METABOLIC PANEL
BUN: 17 mg/dL (ref 6–23)
CHLORIDE: 104 meq/L (ref 96–112)
CO2: 26 mEq/L (ref 19–32)
Calcium: 8.2 mg/dL — ABNORMAL LOW (ref 8.4–10.5)
Creatinine, Ser: 0.64 mg/dL (ref 0.50–1.35)
GFR calc non Af Amer: >90 mL/min (ref >90)
GLUCOSE: 104 mg/dL — AB (ref 70–99)
POTASSIUM: 4 meq/L (ref 3.7–5.3)
Sodium: 140 mEq/L (ref 137–147)

## 2013-10-19 NOTE — Progress Notes (Signed)
PT Cancellation Note  Patient Details Name: Spencer Cunningham MRN: [id number] DOB: 1955/07/11   Cancelled Treatment:    Reason Eval/Treat Not Completed: Patient declined, no reason specified. Pr reports he has walked "10" laps already this am. He has no questions for therapy about gait, stairs or home exercises. Reports he is ready to go home. RN notified.   Willow Ora 10/19/2013, 10:44 AM  Willow Ora, PTA Office- [phone]

## 2013-10-19 NOTE — Progress Notes (Signed)
Spencer Cunningham  MRN: [id number] DOB/Age: 59-Apr-1957 59 y.o. Physician: Rada Hay Procedure: Procedure(s) (LRB): LEFT TOTAL HIP ARTHROPLASTY ANTERIOR APPROACH (Left)     Subjective: Doing great, minimal pain. Has done 5 laps this am  Vital Signs Temp:  [98 F (36.7 C)-98.4 F (36.9 C)] 98 F (36.7 C) (06/28 0630) Pulse Rate:  [62-75] 62 (06/28 0630) Resp:  [18] 18 (06/28 0630) BP: (101-114)/(60-68) 102/62 mmHg (06/28 0630) SpO2:  [95 %-99 %] 98 % (06/28 0630)  Lab Results  Recent Labs  10/18/13 0505 10/19/13 0605  WBC 11.7* 9.9  HGB 11.2* 10.2*  HCT 32.7* 29.7*  PLT 179 150   BMET  Recent Labs  10/18/13 0505 10/19/13 0605  NA 141 140  K 3.9 4.0  CL 104 104  CO2 25 26  GLUCOSE 112* 104*  BUN 14 17  CREATININE 0.68 0.64  CALCIUM 8.6 8.2*   INR  Date Value Ref Range Status  10/07/2013 1.39  0.00 - 1.49 Final     Exam Left hip dressing dry NVI        Plan DC home Instructions in chart   SHUFORD,TRACY for Dr.Kevin Supple 10/19/2013, 9:14 AM

## 2013-10-21 ENCOUNTER — Encounter (HOSPITAL_COMMUNITY): Payer: Self-pay | Admitting: Orthopedic Surgery

## 2015-12-02 IMAGING — CR DG CHEST 2V
2 series · 2 of 2 positions shown · non-contrast
Comparison: March 29, 2011

CLINICAL DATA: Preoperative left hip replacement. History of throat
carcinoma

EXAM:
CHEST  2 VIEW

[w chest pa]
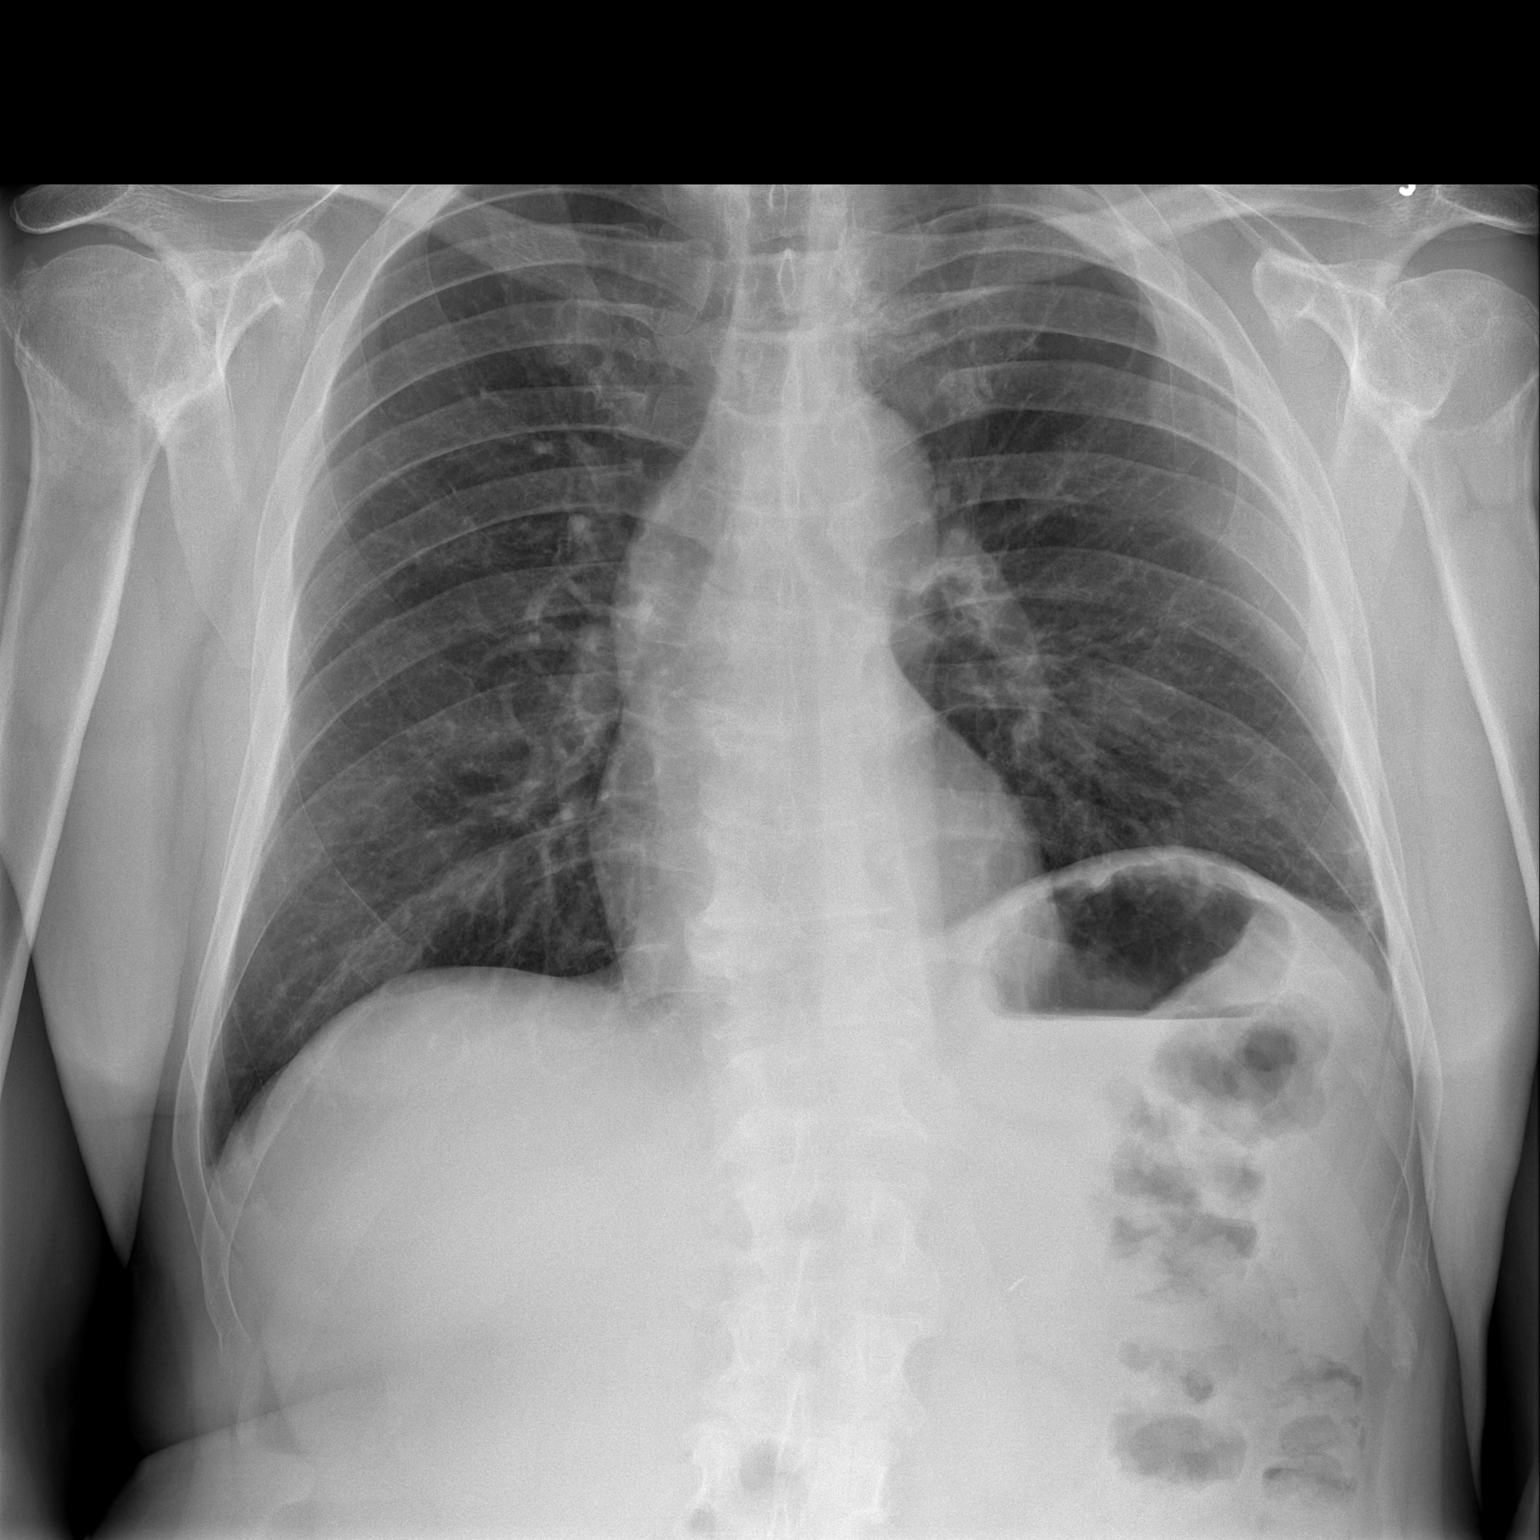

[w chest lat]
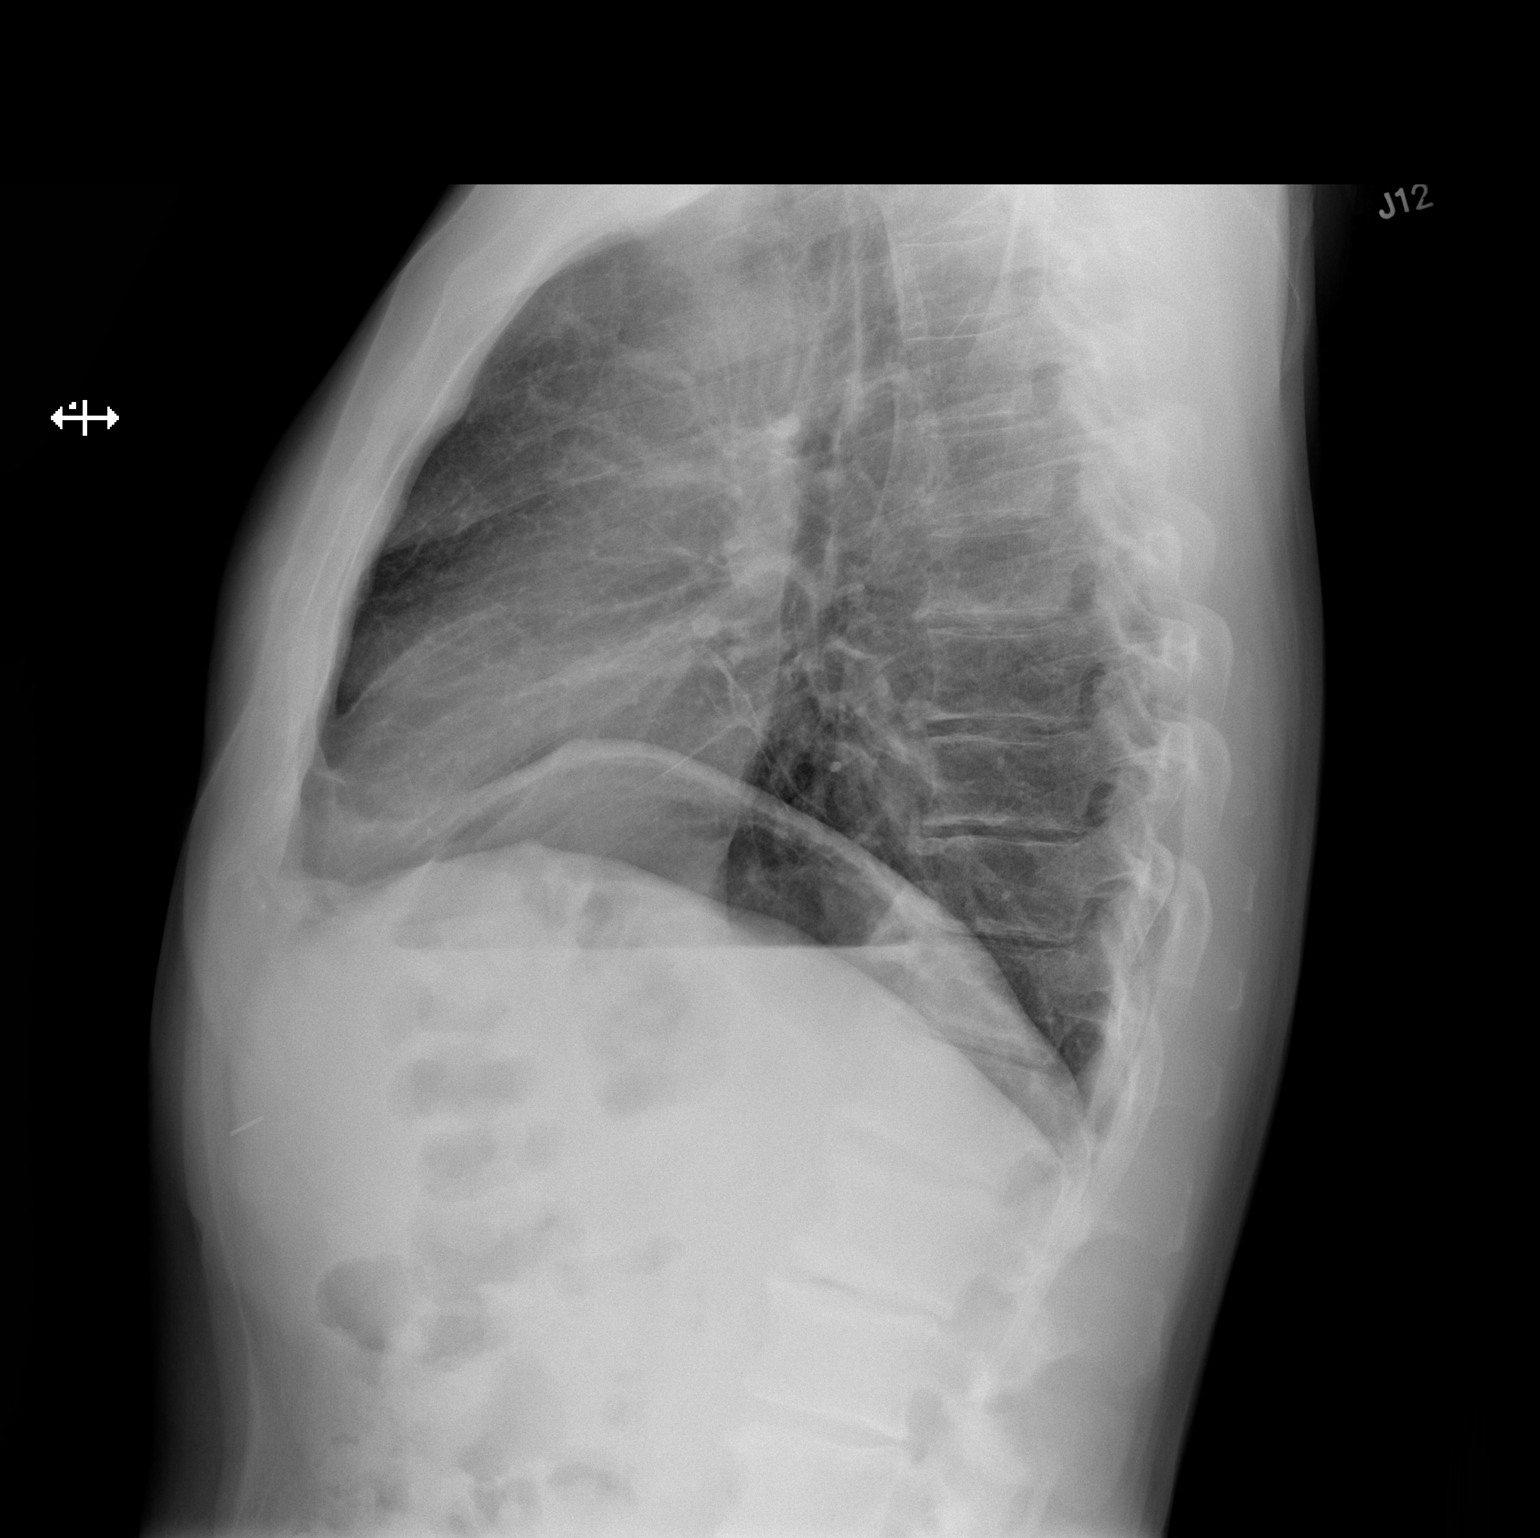

[2 of 2 positions shown; findings below may reference images not displayed]

FINDINGS: There is less opacity along the periphery of the left upper hemi
thorax and apex compared to the prior study. Subtle asymmetry in
this area on the left compared to the right does remain. There is
minimal scarring in the left base. Elsewhere, lungs are clear. The
heart size and pulmonary vascularity are normal. No adenopathy.
There is degenerative change in the thoracic spine. There is
thoracolumbar levoscoliosis.
IMPRESSION: Asymmetric subtle pleural thickening on the left laterally extending
toward the apex is much less apparent compared to the prior study
from 0120. There is no new pleural thickening or opacity of this
nature. Elsewhere lungs are clear except for mild left base
scarring. No adenopathy.

## 2015-12-12 IMAGING — RF DG HIP OPERATIVE*R*
1 series · 2 of 2 positions shown · non-contrast
Comparison: Radiograph 10/07/2013

CLINICAL DATA: Osteoarthritis of the left hip.

EXAM:
DG OPERATIVE RIGHT HIP
TECHNIQUE: A single spot fluoroscopic AP image of the right hip is submitted.

[Series 1: run · 2 of 2 slices shown]
[im 1/2]
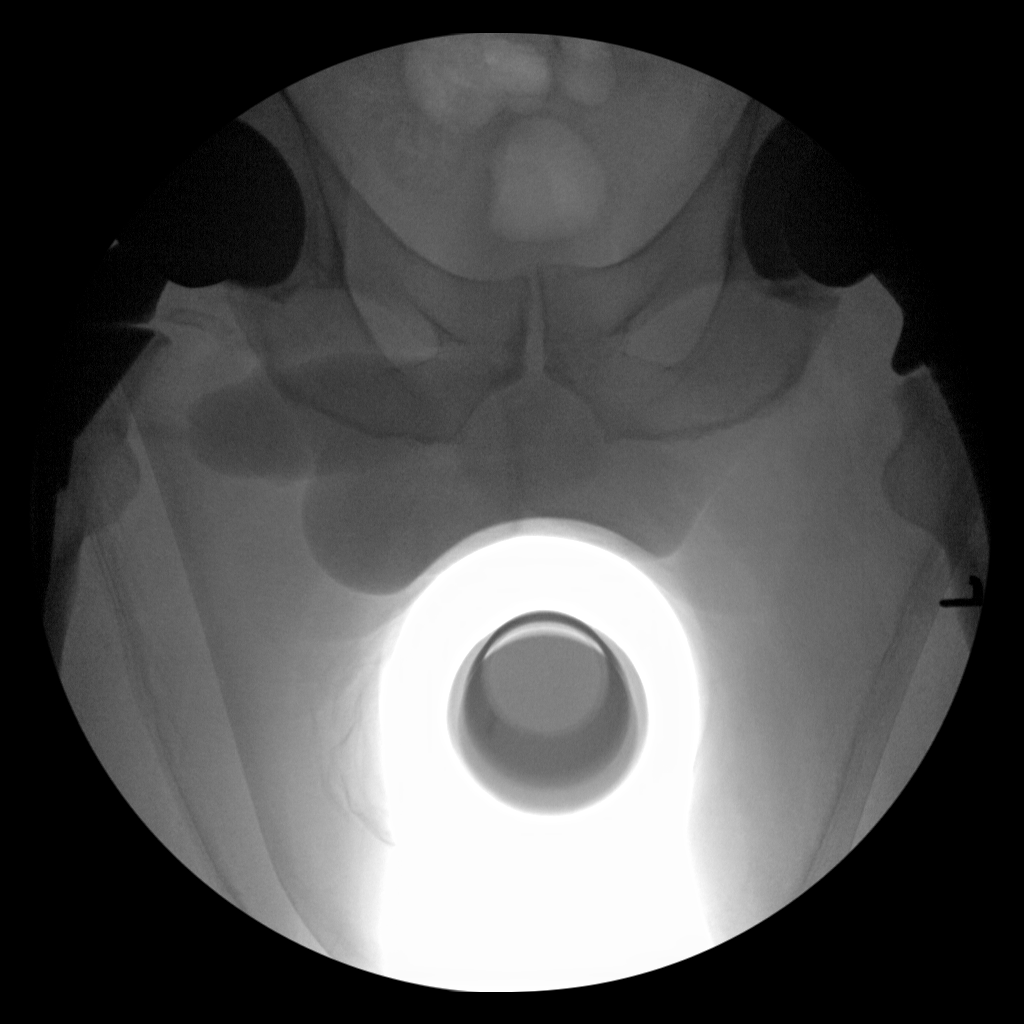
[im 2/2]
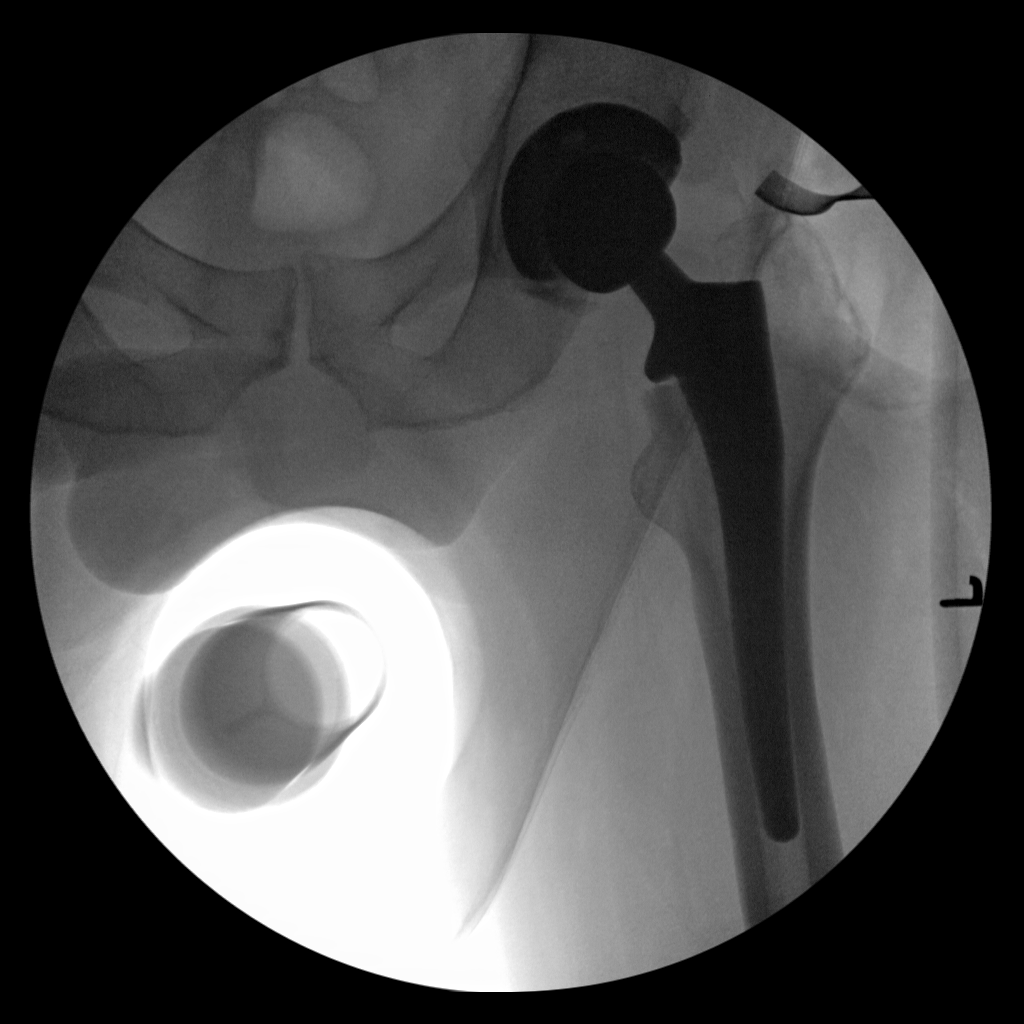

[2 of 2 positions shown; findings below may reference images not displayed]

FINDINGS: AP C-arm images demonstrate left total hip prosthesis in excellent
position in the AP projection. No fracture.
IMPRESSION: New left total hip prosthesis appears in excellent position in the
AP projection.

## 2022-03-16 ENCOUNTER — Emergency Department (HOSPITAL_BASED_OUTPATIENT_CLINIC_OR_DEPARTMENT_OTHER): Payer: Medicare Other

## 2022-03-16 ENCOUNTER — Emergency Department (HOSPITAL_BASED_OUTPATIENT_CLINIC_OR_DEPARTMENT_OTHER)
Admission: EM | Admit: 2022-03-16 | Discharge: 2022-03-16 | Disposition: A | Discharge status: Home / Self Care | Payer: Medicare Other | Attending: Emergency Medicine | Admitting: Emergency Medicine

## 2022-03-16 ENCOUNTER — Encounter (HOSPITAL_BASED_OUTPATIENT_CLINIC_OR_DEPARTMENT_OTHER): Payer: Self-pay

## 2022-03-16 ENCOUNTER — Other Ambulatory Visit: Payer: Self-pay

## 2022-03-16 ENCOUNTER — Emergency Department (HOSPITAL_BASED_OUTPATIENT_CLINIC_OR_DEPARTMENT_OTHER): Payer: Medicare Other | Admitting: Radiology

## 2022-03-16 DIAGNOSIS — J45901 Unspecified asthma with (acute) exacerbation: Secondary | ICD-10-CM | POA: Diagnosis not present

## 2022-03-16 DIAGNOSIS — Z86718 Personal history of other venous thrombosis and embolism: Secondary | ICD-10-CM | POA: Diagnosis not present

## 2022-03-16 DIAGNOSIS — R0602 Shortness of breath: Secondary | ICD-10-CM | POA: Diagnosis present

## 2022-03-16 DIAGNOSIS — U071 COVID-19: Secondary | ICD-10-CM | POA: Insufficient documentation

## 2022-03-16 DIAGNOSIS — Z7901 Long term (current) use of anticoagulants: Secondary | ICD-10-CM | POA: Insufficient documentation

## 2022-03-16 DIAGNOSIS — Z7951 Long term (current) use of inhaled steroids: Secondary | ICD-10-CM | POA: Insufficient documentation

## 2022-03-16 LAB — CBC
HCT: 45.6 % (ref 39.0–52.0)
Hemoglobin: 16 g/dL (ref 13.0–17.0)
MCH: 33 pg (ref 26.0–34.0)
MCHC: 35.1 g/dL (ref 30.0–36.0)
MCV: 94 fL (ref 80.0–100.0)
Platelets: 201 10*3/uL (ref 150–400)
RBC: 4.85 MIL/uL (ref 4.22–5.81)
RDW: 13.2 % (ref 11.5–15.5)
WBC: 7.5 10*3/uL (ref 4.0–10.5)
nRBC: 0 % (ref 0.0–0.2)

## 2022-03-16 LAB — COMPREHENSIVE METABOLIC PANEL
ALT: 13 U/L (ref 0–44)
AST: 27 U/L (ref 15–41)
Albumin: 3.9 g/dL (ref 3.5–5.0)
Alkaline Phosphatase: 37 U/L — ABNORMAL LOW (ref 38–126)
Anion gap: 11 (ref 5–15)
BUN: 15 mg/dL (ref 8–23)
CO2: 22 mmol/L (ref 22–32)
Calcium: 9.5 mg/dL (ref 8.9–10.3)
Chloride: 104 mmol/L (ref 98–111)
Creatinine, Ser: 0.84 mg/dL (ref 0.61–1.24)
GFR, Estimated: >60 mL/min (ref >60)
Glucose, Bld: 141 mg/dL — ABNORMAL HIGH (ref 70–99)
Potassium: 3.9 mmol/L (ref 3.5–5.1)
Sodium: 137 mmol/L (ref 135–145)
Total Bilirubin: 0.5 mg/dL (ref 0.3–1.2)
Total Protein: 6.8 g/dL (ref 6.5–8.1)

## 2022-03-16 LAB — RESP PANEL BY RT-PCR (FLU A&B, COVID) ARPGX2
Influenza A by PCR: NEGATIVE
Influenza B by PCR: NEGATIVE
SARS Coronavirus 2 by RT PCR: POSITIVE — AB

## 2022-03-16 LAB — LACTIC ACID, PLASMA
Lactic Acid, Venous: 1.7 mmol/L (ref 0.5–1.9)
Lactic Acid, Venous: 1.3 mmol/L (ref 0.5–1.9)

## 2022-03-16 LAB — TROPONIN I (HIGH SENSITIVITY)
Troponin I (High Sensitivity): 10 ng/L (ref <18)
Troponin I (High Sensitivity): 10 ng/L (ref <18)

## 2022-03-16 LAB — MAGNESIUM: Magnesium: 1.7 mg/dL (ref 1.7–2.4)

## 2022-03-16 LAB — D-DIMER, QUANTITATIVE: D-Dimer, Quant: 0.48 ug/mL-FEU (ref 0.00–0.50)

## 2022-03-16 MED ORDER — IPRATROPIUM-ALBUTEROL 0.5-2.5 (3) MG/3ML IN SOLN
3.0000 mL | Freq: Once | RESPIRATORY_TRACT | Status: AC
  Administered 2022-03-16: 3 mL via RESPIRATORY_TRACT
  Filled 2022-03-16: qty 3

## 2022-03-16 MED ORDER — ALBUTEROL SULFATE HFA 108 (90 BASE) MCG/ACT IN AERS: 2.0000 | INHALATION_SPRAY | RESPIRATORY_TRACT | Status: DC | PRN

## 2022-03-16 MED ORDER — PREDNISONE 20 MG PO TABS: 40.0000 mg | ORAL_TABLET | Freq: Every day | ORAL | 0 refills | Status: AC

## 2022-03-16 MED ORDER — ALBUTEROL SULFATE HFA 108 (90 BASE) MCG/ACT IN AERS: 1.0000 | INHALATION_SPRAY | Freq: Four times a day (QID) | RESPIRATORY_TRACT | 0 refills | Status: DC | PRN

## 2022-03-16 NOTE — ED Provider Notes (Signed)
[facility] EMERGENCY DEPT Provider Note   CSN: [id number] Arrival date & time: 03/16/22  1950     History  Chief Complaint  Patient presents with   Shortness of Breath    Spencer Cunningham is a 66 y.o. 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  Patient tested positive for covid at home earlier today.  Symptoms have been going on for a couple of days and include cough, sore throat, mild headache.  Patient reports that he is now having SOB with some sharp pain in his chest that is intermittent.  The shortness of breath is worse at night when he lays down and he notes it does not worsen with exertion.  Denies any swelling in his legs. he also endorses wheezing and a childhood history of asthma, but he states that he "grew out of it" and has not had an inhaler or nebulizer since childhood. He reports that he has a history of DVTs/PEs. Patient states that he takes Xarelto daily and has not missed any doses.  Patient states that he received a nebulizer from triage here today which made him feel a lot better he states that his shortness of breath is significantly improved.   Shortness of Breath      Home Medications Prior to Admission medications   Medication Sig Start Date End Date Taking? Authorizing Provider  albuterol (VENTOLIN HFA) 108 (90 Base) MCG/ACT inhaler Inhale 1-2 puffs into the lungs every 6 (six) hours as needed for wheezing or shortness of breath. 03/16/22 04/15/22 Yes Audley Hose, MD  predniSONE (DELTASONE) 20 MG tablet Take 2 tablets (40 mg total) by mouth daily for 5 days. 03/16/22 03/21/22 Yes Audley Hose, MD  cevimeline (EVOXAC) 30 MG capsule Take 30 mg by mouth 3 (three) times daily.    [provider]  DULoxetine (CYMBALTA) 30 MG capsule Take 90 mg by mouth at bedtime.    [provider]  methocarbamol (ROBAXIN) 500 MG tablet Take 1 tablet (500 mg total) by mouth every 6 (six) hours as needed for  muscle spasms. 10/17/13   Gaynelle Arabian, MD  oxyCODONE (OXY IR/ROXICODONE) 5 MG immediate release tablet Take 1-2 tablets (5-10 mg total) by mouth every 3 (three) hours as needed for breakthrough pain. 10/17/13   Gaynelle Arabian, MD  rivaroxaban (XARELTO) 20 MG TABS tablet Take 20 mg by mouth daily before breakfast.     [provider]  simvastatin (ZOCOR) 40 MG tablet Take 40 mg by mouth daily before breakfast.     [provider]  traZODone (DESYREL) 50 MG tablet Take 50 mg by mouth at bedtime as needed for sleep.    [provider]      Allergies    Penicillins    Review of Systems   Review of Systems  Respiratory:  Positive for shortness of breath.    Review of systems positive for SOB.  A 10 point review of systems was performed and is negative unless otherwise reported in HPI.  Physical Exam Updated Vital Signs BP 133/87   Pulse 91   Temp 98.8 F (37.1 C) (Oral)   Resp 19   Ht '5\' 9"'$  (1.753 m)   Wt 86.2 kg   SpO2 94%   BMI 28.06 kg/m  Physical Exam General: Normal appearing male, lying in bed.  HEENT: PERRLA, Sclera anicteric, MMM, trachea midline. Cardiology: RRR, no murmurs/rubs/gallops. BL radial and DP pulses equal bilaterally.  Resp: Normal respiratory  rate and effort. CTAB, no wheezes, rhonchi, crackles. No respiratory distress.  Abd: Soft, non-tender, non-distended. No rebound tenderness or guarding.  GU: Deferred. MSK: No peripheral edema or signs of trauma. Extremities without deformity or TTP. No cyanosis or clubbing. Skin: warm, dry. No rashes or lesions. Neuro: A&Ox4, CNs II-XII grossly intact. MAEs. Sensation grossly intact.  Psych: Normal mood and affect.   ED Results / Procedures / Treatments   Labs (all labs ordered are listed, but only abnormal results are displayed) Labs Reviewed  RESP PANEL BY RT-PCR (FLU A&B, COVID) ARPGX2 - Abnormal; Notable for the following components:      Result Value   SARS Coronavirus 2 by RT PCR  POSITIVE (*)    All other components within normal limits  COMPREHENSIVE METABOLIC PANEL - Abnormal; Notable for the following components:   Glucose, Bld 141 (*)    Alkaline Phosphatase 37 (*)    All other components within normal limits  CBC  MAGNESIUM  LACTIC ACID, PLASMA  LACTIC ACID, PLASMA  D-DIMER, QUANTITATIVE  TROPONIN I (HIGH SENSITIVITY)  TROPONIN I (HIGH SENSITIVITY)    EKG EKG Interpretation  Date/Time:  Thursday March 16 2022 20:00:04 EST Ventricular Rate:  109 PR Interval:  152 QRS Duration: 90 QT Interval:  328 QTC Calculation: 441 R Axis:   -40 Text Interpretation: Sinus tachycardia Left axis deviation Minimal voltage criteria for LVH, may be normal variant ( Cornell product ) Abnormal ECG When compared with ECG of 07-Oct-2013 14:04, Vent. rate has increased BY  42 BPM QRS axis Shifted left Confirmed by Cindee Lame [phone]) on 03/16/2022 8:14:12 PM  Radiology DG Chest Port 1 View  Result Date: 03/16/2022 CLINICAL DATA:  Chest pain EXAM: PORTABLE CHEST 1 VIEW COMPARISON:  10/07/2013 FINDINGS: Pleural density laterally in the left chest could reflect pleural thickening or loculated left pleural effusion. No confluent airspace opacities. Heart is normal size. Mediastinal contours within normal limits. Degenerative changes in the shoulders and thoracic spine. No acute bony abnormality. IMPRESSION: Left pleural density could reflect pleural thickening/scarring or small loculated left pleural effusion. Electronically Signed   By: Rolm Baptise M.D.   On: 03/16/2022 20:42    Procedures Procedures    Medications Ordered in ED Medications  albuterol (VENTOLIN HFA) 108 (90 Base) MCG/ACT inhaler 2 puff (has no administration in time range)  ipratropium-albuterol (DUONEB) 0.5-2.5 (3) MG/3ML nebulizer solution 3 mL (3 mLs Nebulization Given 03/16/22 2016)    ED Course/ Medical Decision Making/ A&P                          Medical Decision Making Amount and/or  Complexity of Data Reviewed Labs: ordered. Decision-making details documented in ED Course. Radiology: ordered. Decision-making details documented in ED Course.  Risk Prescription drug management.   Patient is initially mildly tachycardic in triage but then is HDS without any intervention. He appears mildly uncomfortable but is overall non-toxic appearing.  Patient w/ SOB and CP in s/o positive home covid test. Consider PNA, PTX, PE, ACS/arrhythmia, pulm edema, pleural effusion, asthma/COPD, anemia. Patient does have h/o DVTs/PEs but has been compliant with his xarelto, reports missing no doses. No signs of ischemia, pericarditis, arrhythmia on EKG and patient's troponins are 10-10, negative. He has no leukocytosis or lactate to indicate bacteremia/sepsis. He has no pulm edema or lower extremity edema to indicate HF exacerbation. Dimer is negative, no signs of DVT, and with also patient taking xarelto I think likelihood of PE  is low. His CXR does demonstrate a small left pleural effusion vs pleural thickening, however patient had significant improvement in SOB with nebulizer and reported wheezing went away. Patient reports that his symptoms have improved and he would like to be discharged home. For further updates please see ED course.  I have personally reviewed and interpreted all labs and imaging.  Patient was maintained on a cardiac monitor.  I have personally interpreted the telemetry as NSR.  ED Course:  Clinical Course as of 03/16/22 2331  Thu Mar 16, 2022  2052 DG Chest [place] 1 View Left pleural density could reflect pleural thickening/scarring or small loculated left pleural effusion.   [HN]  2052 WBC: 7.5 [HN]  2052 Hemoglobin: 16.0 [HN]  2129 D-Dimer, Quant: 0.48 Negative D-dimer and patient takes xarelto, low likelihood of PE [HN]  2130 Lactic Acid, Venous: 1.7 [HN]  2130 SARS Coronavirus 2 by RT PCR(!): POSITIVE [HN]  2130 Patient states he feels improved after the nebulizer  provided during triage.  [HN]  2301 Because patient reported such improvement with his nebulizer treatment in triage, and because he reports a childhood history of asthma, it is possible that covid-19 cuased an asthma exacerbation today. On repeat auscultation patient still has no wheezing though he states he had wheezing prior to the nebulizer. Will treat patient for an asthma exacerbation with prednisone 40 mg PO qd x 5 days and prescribe an albuterol inhaler to take 1-2 puffs q6h prn for wheezing/SOB. Patient reports understanding. He states he does not feel SOB anymore and has no CP. He is instructed to f/u with PCP within 1 week and is given DC instructions/return precautions. Also instructed to quarantine x 5 days for covid. He and his wife report understanding.   Dispo: DC in stable condition [HN]    Clinical Course User Index [HN] Audley Hose, MD          Final Clinical Impression(s) / ED Diagnoses Final diagnoses:  COVID-19  Exacerbation of asthma, unspecified asthma severity, unspecified whether persistent    Rx / DC Orders ED Discharge Orders          Ordered    albuterol (VENTOLIN HFA) 108 (90 Base) MCG/ACT inhaler  Every 6 hours PRN        03/16/22 2301    predniSONE (DELTASONE) 20 MG tablet  Daily        03/16/22 2301             This note was created using dictation software, which may contain spelling or grammatical errors.    Audley Hose, MD 03/16/22 [phone]

## 2022-03-16 NOTE — Discharge Instructions (Addendum)
Thank you for coming to [facility] Emergency Department. You were seen for shortness of breath and covid. We did an exam, labs, and imaging, and these showed  no acute findings. It is possible that your Covid-19 set off an asthma exacerbation. I will treat you with an albuterol inhaler and a steroid burst. You will take 40 mg of prednisone once per day for 5 days. You can use the inhaler 1-2 puffs as needed every 6 hours for wheezing, shortness of breath.   Please isolate for 5 days from the start of your symptoms for your covid-19.   Please follow up with your primary care provider within 1 week.   Do not hesitate to return to the ED or call 911 if you experience: -Worsening symptoms -Chest pain, shortness of breath -Lightheadedness, passing out -Fevers/chills -Anything else that concerns you

## 2022-03-16 NOTE — ED Triage Notes (Signed)
Pt reports that he is Covid positive and is now having difficulty breathing with sharp stabbing in his chest. Pt diagnosed with Covid earlier today via home test. Pt with history of DVTs and clots in chest.

## 2022-03-16 NOTE — ED Notes (Signed)
RT assessed pt in triage for SOB w/a home covid + test. Pt states he is having trouble catching his breath. Pt respiratory status stable on RA w/no distress noted BLBS clear/clear dim. RT admin duo w/stated improvement as well as an increase in BLBS to clear throughout. Pt w/no pulmonary hx, hx of smoking. Pt educated on medication process and information. RT will continue to monitor while at [facility] ED.

## 2022-07-10 ENCOUNTER — Ambulatory Visit (HOSPITAL_COMMUNITY): Payer: Self-pay | Admitting: Orthopedic Surgery

## 2022-07-13 NOTE — Progress Notes (Addendum)
Surgical Instructions    Your procedure is scheduled on Thursday, 07/20/22.  Report to [facility] Main Entrance "A" at 9:00 A.M., then check in with the Admitting office.  Call this number if you have problems the morning of surgery:  [phone]   If you have any questions prior to your surgery date call [phone]: Open Monday-Friday 8am-4pm If you experience any cold or flu symptoms such as cough, fever, chills, shortness of breath, etc. between now and your scheduled surgery, please notify us at the above number     Remember:  Do not eat after midnight the night before your surgery  You may drink clear liquids until 8:00am the morning of your surgery.   Clear liquids allowed are: Water, Non-Citrus Juices (without pulp), Carbonated Beverages, Clear Tea, Black Coffee ONLY (NO MILK, CREAM OR POWDERED CREAMER of any kind), and Gatorade    Take these medicines the morning of surgery with A SIP OF WATER:  atorvastatin (LIPITOR)  DULoxetine (CYMBALTA)    As of today, STOP taking any Aspirin (unless otherwise instructed by your surgeon) Aleve, Naproxen, Ibuprofen, Motrin, Advil, Goody's, BC's, all herbal medications, fish oil, and all vitamins.  Stop taking rivaroxaban (XARELTO) 2 days prior to surgery. Your last dose will be 07/17/22.           Do not wear jewelry or makeup. Do not wear lotions, powders, cologne or deodorant. Men may shave face and neck. Do not bring valuables to the hospital. Do not wear nail polish, gel polish, artificial nails, or any other type of covering on natural nails (fingers and toes) If you have artificial nails or gel coating that need to be removed by a nail salon, please have this removed prior to surgery. Artificial nails or gel coating may interfere with anesthesia's ability to adequately monitor your vital signs.  [facility] is not responsible for any belongings or valuables.    Do NOT Smoke (Tobacco/Vaping)  24 hours prior to your  procedure  If you use a CPAP at night, you may bring your mask for your overnight stay.   Contacts, glasses, hearing aids, dentures or partials may not be worn into surgery, please bring cases for these belongings   For patients admitted to the hospital, discharge time will be determined by your treatment team.   Patients discharged the day of surgery will not be allowed to drive home, and someone needs to stay with them for 24 hours.   SURGICAL WAITING ROOM VISITATION Patients having surgery or a procedure may have no more than 2 support people in the waiting area - these visitors may rotate.   Children under the age of 74 must have an adult with them who is not the patient. If the patient needs to stay at the hospital during part of their recovery, the visitor guidelines for inpatient rooms apply. Pre-op nurse will coordinate an appropriate time for 1 support person to accompany patient in pre-op.  This support person may not rotate.   Please refer to [email or web address] for the visitor guidelines for Inpatients (after your surgery is over and you are in a regular room).    Special instructions:    Oral Hygiene is also important to reduce your risk of infection.  Remember - BRUSH YOUR TEETH THE MORNING OF SURGERY WITH YOUR REGULAR TOOTHPASTE   [facility]- Preparing For Surgery  Before surgery, you can play an important role. Because skin is not sterile, your skin needs to be as free of germs  as possible. You can reduce the number of germs on your skin by washing with CHG (chlorahexidine gluconate) Soap before surgery.  CHG is an antiseptic cleaner which kills germs and bonds with the skin to continue killing germs even after washing.     Please do not use if you have an allergy to CHG or antibacterial soaps. If your skin becomes reddened/irritated stop using the CHG.  Do not shave (including legs and underarms) for at least 48 hours  prior to first CHG shower. It is OK to shave your face.  Please follow these instructions carefully.     Shower the NIGHT BEFORE SURGERY and the MORNING OF SURGERY with CHG Soap.   If you chose to wash your hair, wash your hair first as usual with your normal shampoo. After you shampoo, rinse your hair and body thoroughly to remove the shampoo.  Then [employer] and genitals (private parts) with your normal soap and rinse thoroughly to remove soap.  After that Use CHG Soap as you would any other liquid soap. You can apply CHG directly to the skin and wash gently with a scrungie or a clean washcloth.   Apply the CHG Soap to your body ONLY FROM THE NECK DOWN.  Do not use on open wounds or open sores. Avoid contact with your eyes, ears, mouth and genitals (private parts). Wash Face and genitals (private parts)  with your normal soap.   Wash thoroughly, paying special attention to the area where your surgery will be performed.  Thoroughly rinse your body with warm water from the neck down.  DO NOT shower/wash with your normal soap after using and rinsing off the CHG Soap.  Pat yourself dry with a CLEAN TOWEL.  Wear CLEAN PAJAMAS to bed the night before surgery  Place CLEAN SHEETS on your bed the night before your surgery  DO NOT SLEEP WITH PETS.   Day of Surgery: Take a shower with CHG soap. Wear Clean/Comfortable clothing the morning of surgery Do not apply any deodorants/lotions.   Remember to brush your teeth WITH YOUR REGULAR TOOTHPASTE.    If you received a COVID test during your pre-op visit, it is requested that you wear a mask when out in public, stay away from anyone that may not be feeling well, and notify your surgeon if you develop symptoms. If you have been in contact with anyone that has tested positive in the last 10 days, please notify your surgeon.    Please read over the following fact sheets that you were given.

## 2022-07-14 ENCOUNTER — Other Ambulatory Visit: Payer: Self-pay

## 2022-07-14 ENCOUNTER — Encounter (HOSPITAL_COMMUNITY): Payer: Self-pay

## 2022-07-14 ENCOUNTER — Encounter (HOSPITAL_COMMUNITY)
Admission: RE | Admit: 2022-07-14 | Discharge: 2022-07-14 | Disposition: A | Discharge status: Home / Self Care | Payer: Medicare Other | Source: Ambulatory Visit | Attending: Orthopedic Surgery | Admitting: Orthopedic Surgery

## 2022-07-14 VITALS — BP 116/75 | HR 86 | Temp 97.8°F | Resp 17 | Ht 69.0 in | Wt 194.9 lb

## 2022-07-14 DIAGNOSIS — Z01812 Encounter for preprocedural laboratory examination: Secondary | ICD-10-CM | POA: Diagnosis present

## 2022-07-14 DIAGNOSIS — Z01818 Encounter for other preprocedural examination: Secondary | ICD-10-CM

## 2022-07-14 DIAGNOSIS — I2510 Atherosclerotic heart disease of native coronary artery without angina pectoris: Secondary | ICD-10-CM | POA: Diagnosis not present

## 2022-07-14 LAB — CBC
HCT: 45.9 % (ref 39.0–52.0)
Hemoglobin: 15.5 g/dL (ref 13.0–17.0)
MCH: 33 pg (ref 26.0–34.0)
MCHC: 33.8 g/dL (ref 30.0–36.0)
MCV: 97.7 fL (ref 80.0–100.0)
Platelets: 208 10*3/uL (ref 150–400)
RBC: 4.7 MIL/uL (ref 4.22–5.81)
RDW: 13.9 % (ref 11.5–15.5)
WBC: 7.3 10*3/uL (ref 4.0–10.5)
nRBC: 0 % (ref 0.0–0.2)

## 2022-07-14 LAB — BASIC METABOLIC PANEL
Anion gap: 8 (ref 5–15)
BUN: 22 mg/dL (ref 8–23)
CO2: 23 mmol/L (ref 22–32)
Calcium: 8.5 mg/dL — ABNORMAL LOW (ref 8.9–10.3)
Chloride: 106 mmol/L (ref 98–111)
Creatinine, Ser: 0.85 mg/dL (ref 0.61–1.24)
GFR, Estimated: >60 mL/min (ref >60)
Glucose, Bld: 131 mg/dL — ABNORMAL HIGH (ref 70–99)
Potassium: 3.6 mmol/L (ref 3.5–5.1)
Sodium: 137 mmol/L (ref 135–145)

## 2022-07-14 LAB — TYPE AND SCREEN
ABO/RH(D): A POS
Antibody Screen: NEGATIVE

## 2022-07-14 LAB — SURGICAL PCR SCREEN
MRSA, PCR: NEGATIVE
Staphylococcus aureus: NEGATIVE

## 2022-07-14 NOTE — Progress Notes (Signed)
PCP - Sadie Haber [facility] Melter) Cardiologist - denies  PPM/ICD - denies  Chest x-ray - n/a EKG - 03/21/22 Stress Test - denies ECHO - denies Cardiac Cath - denies  Sleep Study - denies  Stop xarelto for 2 days.  ERAS Protcol -yes PRE-SURGERY Ensure or G2- not ordered  COVID TEST- not needed   Anesthesia review: received medical clearance   Patient denies shortness of breath, fever, cough and chest pain at PAT appointment   All instructions explained to the patient, with a verbal understanding of the material. Patient agrees to go over the instructions while at home for a better understanding. Patient also instructed to self quarantine after being tested for COVID-19. The opportunity to ask questions was provided.

## 2022-07-20 ENCOUNTER — Other Ambulatory Visit: Payer: Self-pay

## 2022-07-20 ENCOUNTER — Inpatient Hospital Stay (HOSPITAL_COMMUNITY): Payer: Medicare Other

## 2022-07-20 ENCOUNTER — Inpatient Hospital Stay (HOSPITAL_COMMUNITY)
Admission: RE | Admit: 2022-07-20 | Discharge: 2022-07-21 | DRG: 473 | Disposition: A | Discharge status: Home / Self Care | Payer: Medicare Other | Attending: Orthopedic Surgery | Admitting: Orthopedic Surgery

## 2022-07-20 ENCOUNTER — Encounter (HOSPITAL_COMMUNITY): Payer: Self-pay | Admitting: Orthopedic Surgery

## 2022-07-20 ENCOUNTER — Inpatient Hospital Stay (HOSPITAL_COMMUNITY): Payer: Medicare Other | Admitting: Certified Registered"

## 2022-07-20 ENCOUNTER — Encounter (HOSPITAL_COMMUNITY)
Admission: RE | Disposition: A | Discharge status: Home / Self Care | Payer: Self-pay | Source: Home / Self Care | Attending: Orthopedic Surgery

## 2022-07-20 DIAGNOSIS — M40202 Unspecified kyphosis, cervical region: Secondary | ICD-10-CM | POA: Diagnosis present

## 2022-07-20 DIAGNOSIS — M5412 Radiculopathy, cervical region: Secondary | ICD-10-CM | POA: Diagnosis present

## 2022-07-20 DIAGNOSIS — M50123 Cervical disc disorder at C6-C7 level with radiculopathy: Secondary | ICD-10-CM | POA: Diagnosis present

## 2022-07-20 DIAGNOSIS — M50122 Cervical disc disorder at C5-C6 level with radiculopathy: Secondary | ICD-10-CM | POA: Diagnosis present

## 2022-07-20 DIAGNOSIS — Z880 Allergy status to penicillin: Secondary | ICD-10-CM | POA: Diagnosis not present

## 2022-07-20 DIAGNOSIS — I739 Peripheral vascular disease, unspecified: Secondary | ICD-10-CM | POA: Diagnosis present

## 2022-07-20 DIAGNOSIS — Z86711 Personal history of pulmonary embolism: Secondary | ICD-10-CM

## 2022-07-20 DIAGNOSIS — Z87891 Personal history of nicotine dependence: Secondary | ICD-10-CM | POA: Diagnosis not present

## 2022-07-20 DIAGNOSIS — M159 Polyosteoarthritis, unspecified: Secondary | ICD-10-CM | POA: Diagnosis present

## 2022-07-20 DIAGNOSIS — M4802 Spinal stenosis, cervical region: Secondary | ICD-10-CM | POA: Diagnosis present

## 2022-07-20 DIAGNOSIS — Z79899 Other long term (current) drug therapy: Secondary | ICD-10-CM

## 2022-07-20 DIAGNOSIS — M4722 Other spondylosis with radiculopathy, cervical region: Secondary | ICD-10-CM

## 2022-07-20 DIAGNOSIS — Z87442 Personal history of urinary calculi: Secondary | ICD-10-CM

## 2022-07-20 DIAGNOSIS — M50121 Cervical disc disorder at C4-C5 level with radiculopathy: Secondary | ICD-10-CM | POA: Diagnosis present

## 2022-07-20 DIAGNOSIS — Z7901 Long term (current) use of anticoagulants: Secondary | ICD-10-CM | POA: Diagnosis not present

## 2022-07-20 HISTORY — PX: ANTERIOR CERVICAL DECOMPRESSION/DISCECTOMY FUSION 4 LEVELS: SHX5556

## 2022-07-20 SURGERY — ANTERIOR CERVICAL DECOMPRESSION/DISCECTOMY FUSION 4 LEVELS
Anesthesia: General

## 2022-07-20 MED ORDER — FENTANYL CITRATE (PF) 250 MCG/5ML IJ SOLN
INTRAMUSCULAR | Status: AC
  Filled 2022-07-20: qty 5

## 2022-07-20 MED ORDER — FENTANYL CITRATE (PF) 250 MCG/5ML IJ SOLN
INTRAMUSCULAR | Status: DC | PRN
  Administered 2022-07-20 (×2): 50 ug via INTRAVENOUS
  Administered 2022-07-20: 100 ug via INTRAVENOUS
  Administered 2022-07-20: 150 ug via INTRAVENOUS

## 2022-07-20 MED ORDER — PROPOFOL 10 MG/ML IV BOLUS
INTRAVENOUS | Status: DC | PRN
  Administered 2022-07-20: 160 mg via INTRAVENOUS

## 2022-07-20 MED ORDER — 0.9 % SODIUM CHLORIDE (POUR BTL) OPTIME
TOPICAL | Status: DC | PRN
  Administered 2022-07-20 (×3): 1000 mL

## 2022-07-20 MED ORDER — OXYCODONE HCL 5 MG PO TABS: 5.0000 mg | ORAL_TABLET | ORAL | Status: DC | PRN

## 2022-07-20 MED ORDER — FENTANYL CITRATE (PF) 100 MCG/2ML IJ SOLN
25.0000 ug | INTRAMUSCULAR | Status: DC | PRN
  Administered 2022-07-20: 50 ug via INTRAVENOUS
  Administered 2022-07-20: 25 ug via INTRAVENOUS

## 2022-07-20 MED ORDER — CHLORHEXIDINE GLUCONATE 0.12 % MT SOLN
15.0000 mL | Freq: Once | OROMUCOSAL | Status: AC
  Administered 2022-07-20: 15 mL via OROMUCOSAL
  Filled 2022-07-20: qty 15

## 2022-07-20 MED ORDER — DEXAMETHASONE SODIUM PHOSPHATE 10 MG/ML IJ SOLN
INTRAMUSCULAR | Status: AC
  Filled 2022-07-20: qty 1

## 2022-07-20 MED ORDER — SODIUM CHLORIDE 0.9% FLUSH: 3.0000 mL | INTRAVENOUS | Status: DC | PRN

## 2022-07-20 MED ORDER — DEXAMETHASONE SODIUM PHOSPHATE 4 MG/ML IJ SOLN
4.0000 mg | Freq: Four times a day (QID) | INTRAMUSCULAR | Status: DC
  Administered 2022-07-20 – 2022-07-21 (×3): 4 mg via INTRAVENOUS
  Filled 2022-07-20 (×3): qty 1

## 2022-07-20 MED ORDER — SODIUM CHLORIDE 0.9 % IV SOLN: 250.0000 mL | INTRAVENOUS | Status: DC

## 2022-07-20 MED ORDER — POLYETHYLENE GLYCOL 3350 17 G PO PACK: 17.0000 g | PACK | Freq: Every day | ORAL | Status: DC | PRN

## 2022-07-20 MED ORDER — ACETAMINOPHEN 500 MG PO TABS: 1000.0000 mg | ORAL_TABLET | Freq: Once | ORAL | Status: DC | PRN

## 2022-07-20 MED ORDER — METHOCARBAMOL 500 MG PO TABS: 500.0000 mg | ORAL_TABLET | Freq: Three times a day (TID) | ORAL | 0 refills | Status: AC | PRN

## 2022-07-20 MED ORDER — PROPOFOL 10 MG/ML IV BOLUS
INTRAVENOUS | Status: AC
  Filled 2022-07-20: qty 20

## 2022-07-20 MED ORDER — PHENYLEPHRINE 80 MCG/ML (10ML) SYRINGE FOR IV PUSH (FOR BLOOD PRESSURE SUPPORT)
PREFILLED_SYRINGE | INTRAVENOUS | Status: AC
  Filled 2022-07-20: qty 10

## 2022-07-20 MED ORDER — DEXAMETHASONE 4 MG PO TABS: 4.0000 mg | ORAL_TABLET | Freq: Four times a day (QID) | ORAL | Status: DC

## 2022-07-20 MED ORDER — KETAMINE HCL 50 MG/5ML IJ SOSY
PREFILLED_SYRINGE | INTRAMUSCULAR | Status: AC
  Filled 2022-07-20: qty 5

## 2022-07-20 MED ORDER — ONDANSETRON HCL 4 MG/2ML IJ SOLN: 4.0000 mg | Freq: Four times a day (QID) | INTRAMUSCULAR | Status: DC | PRN

## 2022-07-20 MED ORDER — ORAL CARE MOUTH RINSE: 15.0000 mL | Freq: Once | OROMUCOSAL | Status: AC

## 2022-07-20 MED ORDER — OXYCODONE HCL 5 MG PO TABS
10.0000 mg | ORAL_TABLET | ORAL | Status: DC | PRN
  Administered 2022-07-20 – 2022-07-21 (×4): 10 mg via ORAL
  Filled 2022-07-20 (×4): qty 2

## 2022-07-20 MED ORDER — ONDANSETRON HCL 4 MG PO TABS: 4.0000 mg | ORAL_TABLET | Freq: Three times a day (TID) | ORAL | 0 refills | Status: DC | PRN

## 2022-07-20 MED ORDER — DULOXETINE HCL 30 MG PO CPEP
60.0000 mg | ORAL_CAPSULE | Freq: Every morning | ORAL | Status: DC
  Administered 2022-07-20: 60 mg via ORAL
  Filled 2022-07-20: qty 2

## 2022-07-20 MED ORDER — ATORVASTATIN CALCIUM 40 MG PO TABS: 40.0000 mg | ORAL_TABLET | Freq: Every morning | ORAL | Status: DC

## 2022-07-20 MED ORDER — ROCURONIUM BROMIDE 10 MG/ML (PF) SYRINGE
PREFILLED_SYRINGE | INTRAVENOUS | Status: AC
  Filled 2022-07-20: qty 10

## 2022-07-20 MED ORDER — PHENYLEPHRINE 80 MCG/ML (10ML) SYRINGE FOR IV PUSH (FOR BLOOD PRESSURE SUPPORT)
PREFILLED_SYRINGE | INTRAVENOUS | Status: DC | PRN
  Administered 2022-07-20 (×4): 80 ug via INTRAVENOUS

## 2022-07-20 MED ORDER — ACETAMINOPHEN 160 MG/5ML PO SOLN: 1000.0000 mg | Freq: Once | ORAL | Status: DC | PRN

## 2022-07-20 MED ORDER — OXYCODONE HCL 5 MG PO TABS: 5.0000 mg | ORAL_TABLET | Freq: Once | ORAL | Status: DC | PRN

## 2022-07-20 MED ORDER — ACETAMINOPHEN 10 MG/ML IV SOLN
INTRAVENOUS | Status: AC
  Filled 2022-07-20: qty 100

## 2022-07-20 MED ORDER — OXYCODONE-ACETAMINOPHEN 10-325 MG PO TABS: 1.0000 | ORAL_TABLET | Freq: Four times a day (QID) | ORAL | 0 refills | Status: AC | PRN

## 2022-07-20 MED ORDER — PHENOL 1.4 % MT LIQD: 1.0000 | OROMUCOSAL | Status: DC | PRN

## 2022-07-20 MED ORDER — ACETAMINOPHEN 10 MG/ML IV SOLN
INTRAVENOUS | Status: DC | PRN
  Administered 2022-07-20: 1000 mg via INTRAVENOUS

## 2022-07-20 MED ORDER — HEMOSTATIC AGENTS (NO CHARGE) OPTIME
TOPICAL | Status: DC | PRN
  Administered 2022-07-20 (×2): 1 via TOPICAL

## 2022-07-20 MED ORDER — LACTATED RINGERS IV SOLN: INTRAVENOUS | Status: DC

## 2022-07-20 MED ORDER — ROCURONIUM BROMIDE 100 MG/10ML IV SOLN
INTRAVENOUS | Status: DC | PRN
  Administered 2022-07-20: 20 mg via INTRAVENOUS
  Administered 2022-07-20 (×2): 30 mg via INTRAVENOUS
  Administered 2022-07-20: 20 mg via INTRAVENOUS
  Administered 2022-07-20: 70 mg via INTRAVENOUS
  Administered 2022-07-20: 20 mg via INTRAVENOUS

## 2022-07-20 MED ORDER — ONDANSETRON HCL 4 MG/2ML IJ SOLN
INTRAMUSCULAR | Status: DC | PRN
  Administered 2022-07-20: 4 mg via INTRAVENOUS

## 2022-07-20 MED ORDER — CEFAZOLIN SODIUM 1 G IJ SOLR
INTRAMUSCULAR | Status: AC
  Filled 2022-07-20: qty 10

## 2022-07-20 MED ORDER — TRAZODONE HCL 50 MG PO TABS
100.0000 mg | ORAL_TABLET | Freq: Every evening | ORAL | Status: DC | PRN
  Administered 2022-07-21: 100 mg via ORAL
  Filled 2022-07-20: qty 2

## 2022-07-20 MED ORDER — DEXAMETHASONE SODIUM PHOSPHATE 10 MG/ML IJ SOLN
INTRAMUSCULAR | Status: DC | PRN
  Administered 2022-07-20: 10 mg via INTRAVENOUS

## 2022-07-20 MED ORDER — BUPIVACAINE-EPINEPHRINE 0.25% -1:200000 IJ SOLN
INTRAMUSCULAR | Status: DC | PRN
  Administered 2022-07-20: 10 mL

## 2022-07-20 MED ORDER — ONDANSETRON HCL 4 MG PO TABS: 4.0000 mg | ORAL_TABLET | Freq: Four times a day (QID) | ORAL | Status: DC | PRN

## 2022-07-20 MED ORDER — LIDOCAINE 2% (20 MG/ML) 5 ML SYRINGE
INTRAMUSCULAR | Status: AC
  Filled 2022-07-20: qty 5

## 2022-07-20 MED ORDER — EPHEDRINE 5 MG/ML INJ
INTRAVENOUS | Status: AC
  Filled 2022-07-20: qty 5

## 2022-07-20 MED ORDER — CEFAZOLIN SODIUM-DEXTROSE 1-4 GM/50ML-% IV SOLN
1.0000 g | Freq: Three times a day (TID) | INTRAVENOUS | Status: AC
  Administered 2022-07-20 – 2022-07-21 (×2): 1 g via INTRAVENOUS
  Filled 2022-07-20 (×2): qty 50

## 2022-07-20 MED ORDER — MAGNESIUM CITRATE PO SOLN: 1.0000 | Freq: Once | ORAL | Status: DC | PRN

## 2022-07-20 MED ORDER — PHENYLEPHRINE HCL-NACL 20-0.9 MG/250ML-% IV SOLN
INTRAVENOUS | Status: DC | PRN
  Administered 2022-07-20: 50 ug/min via INTRAVENOUS

## 2022-07-20 MED ORDER — MENTHOL 3 MG MT LOZG: 1.0000 | LOZENGE | OROMUCOSAL | Status: DC | PRN

## 2022-07-20 MED ORDER — TRANEXAMIC ACID-NACL 1000-0.7 MG/100ML-% IV SOLN
1000.0000 mg | INTRAVENOUS | Status: AC
  Administered 2022-07-20: 1000 mg via INTRAVENOUS
  Filled 2022-07-20: qty 100

## 2022-07-20 MED ORDER — KETAMINE HCL 10 MG/ML IJ SOLN
INTRAMUSCULAR | Status: DC | PRN
  Administered 2022-07-20: 30 mg via INTRAVENOUS
  Administered 2022-07-20 (×2): 10 mg via INTRAVENOUS

## 2022-07-20 MED ORDER — METHOCARBAMOL 1000 MG/10ML IJ SOLN: 500.0000 mg | Freq: Four times a day (QID) | INTRAVENOUS | Status: DC | PRN

## 2022-07-20 MED ORDER — MIDAZOLAM HCL 5 MG/5ML IJ SOLN
INTRAMUSCULAR | Status: DC | PRN
  Administered 2022-07-20: 2 mg via INTRAVENOUS

## 2022-07-20 MED ORDER — METHOCARBAMOL 500 MG PO TABS
500.0000 mg | ORAL_TABLET | Freq: Four times a day (QID) | ORAL | Status: DC | PRN
  Administered 2022-07-21: 500 mg via ORAL
  Filled 2022-07-20 (×2): qty 1

## 2022-07-20 MED ORDER — EPINEPHRINE PF 1 MG/ML IJ SOLN
INTRAMUSCULAR | Status: AC
  Filled 2022-07-20: qty 1

## 2022-07-20 MED ORDER — ACETAMINOPHEN 325 MG PO TABS
650.0000 mg | ORAL_TABLET | ORAL | Status: DC | PRN
  Administered 2022-07-20 – 2022-07-21 (×2): 650 mg via ORAL
  Filled 2022-07-20 (×2): qty 2

## 2022-07-20 MED ORDER — FENTANYL CITRATE (PF) 100 MCG/2ML IJ SOLN
INTRAMUSCULAR | Status: AC
  Filled 2022-07-20: qty 2

## 2022-07-20 MED ORDER — SUGAMMADEX SODIUM 200 MG/2ML IV SOLN
INTRAVENOUS | Status: DC | PRN
  Administered 2022-07-20: 172.4 mg via INTRAVENOUS

## 2022-07-20 MED ORDER — SODIUM CHLORIDE 0.9% FLUSH
3.0000 mL | Freq: Two times a day (BID) | INTRAVENOUS | Status: DC
  Administered 2022-07-20: 3 mL via INTRAVENOUS

## 2022-07-20 MED ORDER — BUPIVACAINE HCL (PF) 0.25 % IJ SOLN
INTRAMUSCULAR | Status: AC
  Filled 2022-07-20: qty 30

## 2022-07-20 MED ORDER — ONDANSETRON HCL 4 MG/2ML IJ SOLN
INTRAMUSCULAR | Status: AC
  Filled 2022-07-20: qty 2

## 2022-07-20 MED ORDER — CEFAZOLIN SODIUM-DEXTROSE 2-3 GM-%(50ML) IV SOLR
INTRAVENOUS | Status: DC | PRN
  Administered 2022-07-20 (×2): 2 g via INTRAVENOUS

## 2022-07-20 MED ORDER — ACETAMINOPHEN 650 MG RE SUPP: 650.0000 mg | RECTAL | Status: DC | PRN

## 2022-07-20 MED ORDER — THROMBIN 20000 UNITS EX SOLR
CUTANEOUS | Status: AC
  Filled 2022-07-20: qty 20000

## 2022-07-20 MED ORDER — HYDROMORPHONE HCL 1 MG/ML IJ SOLN
1.0000 mg | INTRAMUSCULAR | Status: DC | PRN
  Administered 2022-07-20: 1 mg via INTRAVENOUS
  Filled 2022-07-20: qty 1

## 2022-07-20 MED ORDER — THROMBIN 20000 UNITS EX SOLR
CUTANEOUS | Status: DC | PRN
  Administered 2022-07-20: 20 mL via TOPICAL

## 2022-07-20 MED ORDER — LIDOCAINE HCL (CARDIAC) PF 100 MG/5ML IV SOSY
PREFILLED_SYRINGE | INTRAVENOUS | Status: DC | PRN
  Administered 2022-07-20: 60 mg via INTRATRACHEAL

## 2022-07-20 MED ORDER — MIDAZOLAM HCL 2 MG/2ML IJ SOLN
INTRAMUSCULAR | Status: AC
  Filled 2022-07-20: qty 2

## 2022-07-20 MED ORDER — ACETAMINOPHEN 10 MG/ML IV SOLN: 1000.0000 mg | Freq: Once | INTRAVENOUS | Status: DC | PRN

## 2022-07-20 MED ORDER — OXYCODONE HCL 5 MG/5ML PO SOLN: 5.0000 mg | Freq: Once | ORAL | Status: DC | PRN

## 2022-07-20 SURGICAL SUPPLY — 75 items
AGENT HMST KT MTR STRL THRMB (HEMOSTASIS) ×2
BAG COUNTER SPONGE SURGICOUNT (BAG) ×1 IMPLANT
BAG SPNG CNTER NS LX DISP (BAG) ×1
BAND INSRT 18 STRL LF DISP RB (MISCELLANEOUS)
BAND RUBBER #18 3X1/16 STRL (MISCELLANEOUS) IMPLANT
BLADE CLIPPER SURG (BLADE) IMPLANT
BLADE SURG 15 STRL LF DISP TIS (BLADE) IMPLANT
BLADE SURG 15 STRL SS (BLADE) ×1
CABLE BIPOLOR RESECTION CORD (MISCELLANEOUS) ×1 IMPLANT
CANISTER SUCT 3000ML PPV (MISCELLANEOUS) ×1 IMPLANT
CLSR STERI-STRIP ANTIMIC 1/2X4 (GAUZE/BANDAGES/DRESSINGS) ×1 IMPLANT
COLLAR CERV LO CONTOUR FIRM DE (SOFTGOODS) IMPLANT
COVER MAYO STAND STRL (DRAPES) ×2 IMPLANT
COVER SURGICAL LIGHT HANDLE (MISCELLANEOUS) ×2 IMPLANT
DEVICE ENDSKLTN IMPLANT SM 7MM (Cage) IMPLANT
DEVICE ENDSKLTN TC NANOLCK 6MM (Cage) IMPLANT
DRAIN CHANNEL 15F RND FF W/TCR (WOUND CARE) IMPLANT
DRAPE C-ARM 42X72 X-RAY (DRAPES) ×1 IMPLANT
DRAPE POUCH INSTRU U-SHP 10X18 (DRAPES) IMPLANT
DRAPE SURG 17X23 STRL (DRAPES) ×1 IMPLANT
DRAPE U-SHAPE 47X51 STRL (DRAPES) ×1 IMPLANT
DRSG OPSITE POSTOP 3X4 (GAUZE/BANDAGES/DRESSINGS) IMPLANT
DRSG OPSITE POSTOP 4X6 (GAUZE/BANDAGES/DRESSINGS) ×1 IMPLANT
DURAPREP 26ML APPLICATOR (WOUND CARE) ×1 IMPLANT
ELECT COATED BLADE 2.86 ST (ELECTRODE) ×1 IMPLANT
ELECT PENCIL ROCKER SW 15FT (MISCELLANEOUS) ×1 IMPLANT
ELECT REM PT RETURN 9FT ADLT (ELECTROSURGICAL) ×1
ELECTRODE REM PT RTRN 9FT ADLT (ELECTROSURGICAL) ×1 IMPLANT
ENDOSKELETON IMPLANT SM 7MM (Cage) ×2 IMPLANT
ENDOSKELETON TC NANOLOCK 6MM (Cage) ×2 IMPLANT
GLOVE BIO SURGEON STRL SZ 6.5 (GLOVE) ×1 IMPLANT
GLOVE BIOGEL PI IND STRL 6.5 (GLOVE) ×1 IMPLANT
GLOVE BIOGEL PI IND STRL 8.5 (GLOVE) ×1 IMPLANT
GLOVE SS BIOGEL STRL SZ 8.5 (GLOVE) ×1 IMPLANT
GOWN STRL REUS W/TWL 2XL LVL3 (GOWN DISPOSABLE) ×1 IMPLANT
KIT BASIN OR (CUSTOM PROCEDURE TRAY) ×1 IMPLANT
KIT TURNOVER KIT B (KITS) ×1 IMPLANT
NDL HYPO 22X1.5 SAFETY MO (MISCELLANEOUS) IMPLANT
NDL SPNL 18GX3.5 QUINCKE PK (NEEDLE) ×1 IMPLANT
NDL SPNL 22GX3.5 QUINCKE BK (NEEDLE) IMPLANT
NEEDLE HYPO 22X1.5 SAFETY MO (MISCELLANEOUS) ×1 IMPLANT
NEEDLE SPNL 18GX3.5 QUINCKE PK (NEEDLE) ×1 IMPLANT
NEEDLE SPNL 22GX3.5 QUINCKE BK (NEEDLE) ×1 IMPLANT
NS IRRIG 1000ML POUR BTL (IV SOLUTION) ×1 IMPLANT
PACK ORTHO CERVICAL (CUSTOM PROCEDURE TRAY) ×1 IMPLANT
PACK UNIVERSAL I (CUSTOM PROCEDURE TRAY) ×1 IMPLANT
PAD ARMBOARD 7.5X6 YLW CONV (MISCELLANEOUS) ×2 IMPLANT
PATTIES SURGICAL .5 X.5 (GAUZE/BANDAGES/DRESSINGS) IMPLANT
PIN ACP TEMP FIXATION (EXFIX) IMPLANT
PIN DISTRACTION MAXCESS-C 14 (PIN) IMPLANT
PLATE ACP 2.1X74 4LVL (Plate) IMPLANT
POSITIONER HEAD DONUT 9IN (MISCELLANEOUS) ×1 IMPLANT
PUTTY BONE DBX 2.5 MIS (Bone Implant) IMPLANT
SCREW ACP 3.5X17 S/D VARIA (Screw) IMPLANT
SCREW ACP VA SD 3.5X15 (Screw) IMPLANT
SPONGE INTESTINAL PEANUT (DISPOSABLE) ×1 IMPLANT
SPONGE SURGIFOAM ABS GEL 100 (HEMOSTASIS) ×1 IMPLANT
SPONGE T-LAP 4X18 ~~LOC~~+RFID (SPONGE) ×2 IMPLANT
SURGIFLO W/THROMBIN 8M KIT (HEMOSTASIS) ×1 IMPLANT
SUT BONE WAX W31G (SUTURE) ×1 IMPLANT
SUT MNCRL AB 3-0 PS2 27 (SUTURE) ×1 IMPLANT
SUT SILK 2 0 TIES 17X18 (SUTURE) ×1
SUT SILK 2-0 18XBRD TIE BLK (SUTURE) IMPLANT
SUT VIC AB 2-0 CT1 18 (SUTURE) ×1 IMPLANT
SUT VIC AB 3-0 54X BRD REEL (SUTURE) ×1 IMPLANT
SUT VIC AB 3-0 BRD 54 (SUTURE) ×1
SUT VICRYL AB 2 0 TIES (SUTURE) IMPLANT
SYR BULB IRRIG 60ML STRL (SYRINGE) ×1 IMPLANT
SYR CONTROL 10ML LL (SYRINGE) ×1 IMPLANT
TAPE CLOTH 4X10 WHT NS (GAUZE/BANDAGES/DRESSINGS) ×1 IMPLANT
TAPE UMBILICAL 1/8X30 (MISCELLANEOUS) ×1 IMPLANT
TOWEL GREEN STERILE (TOWEL DISPOSABLE) ×1 IMPLANT
TOWEL GREEN STERILE FF (TOWEL DISPOSABLE) ×1 IMPLANT
TRAY FOLEY MTR SLVR 16FR STAT (SET/KITS/TRAYS/PACK) IMPLANT
WATER STERILE IRR 1000ML POUR (IV SOLUTION) ×1 IMPLANT

## 2022-07-20 NOTE — Transfer of Care (Signed)
Immediate Anesthesia Transfer of Care Note  Patient: Spencer Cunningham  Procedure(s) Performed: ANTERIOR CERVICAL DECOMPRESSION/DISCECTOMY FUSION 4 LEVELS C3-C7  Patient Location: PACU  Anesthesia Type:General  Level of Consciousness: awake and alert   Airway & Oxygen Therapy: Patient Spontanous Breathing and Patient connected to face mask oxygen  Post-op Assessment: Report given to RN and Post -op Vital signs reviewed and stable  Post vital signs: Reviewed and stable  Last Vitals:  Vitals Value Taken Time  BP 150/90   Temp 98   Pulse 80   Resp 16   SpO2 96     Last Pain:  Vitals:   07/20/22 0930  TempSrc:   PainSc: 5          Complications: No notable events documented.

## 2022-07-20 NOTE — Discharge Instructions (Addendum)
Today you will be discharged from the hospital.  The purpose of the following handout is to help guide you over the next 2 weeks.  First and foremost, be sure you have a follow up appointment with Dr. Rolena Infante 2 weeks from the time of your surgery to have your sutures removed.  Please call [place] [phone] to schedule or confirm this appointment.      Brace You do not have to wear the collar while lying in bed or sitting in a high-backed chair, eating, sleeping or showering.  Other than these instances, you must wear the brace.  You may NOT wear the collar while driving a vehicle (see driving restrictions below).  It is advisable that you wear the collar in public places or while traveling in a car as a passenger.  Dr. Rolena Infante will discuss further use of the collar at your 2 week postop visit.  Wound Care You may SHOWER 5 days from the date of surgery.  Shower directly over the steri-strips.  DO NOT scrub or submerge (bath tub, swimming pool, hot tub, etc.) the area.  Pat to dry following your shower.  There is no need for additional dressings other than the steri-strips.  Allow the steri-strips to fall off on their own.  Once the strips have fallen off, you may leave the area undressed.  DO NOT apply lotion/cream/ointment to the area.  The wound must remain dry at all times other than while showering.  Dr. Rolena Infante or his staff will remove your stiches at your first postop visit and give you additional instructions regarding wound care at that time.   Activity NO DRIVING FOR 2 WEEKS.  No lifting over 5 pounds (approximately a gallon of milk).  No bending, stooping, squatting or twisting.  No overhead activities.  We encourage you to walk (short distances and often throughout the day) as you can tolerate.  A good rule of thumb is to get up and move once or twice every hour.  You may go up and down stairs carefully.  As you continue to recover, Dr. Rolena Infante will address and  adjust restrictions to your activities until no further restrictions are needed.  However, until your first postop visit, when Dr. Rolena Infante can assess your recovery, you are to follow these instructions.  At the end of this document is a tentative outline of activities for up to 1 year.       Medication You will be discharged from the hospital with medication for pain, spasm, nausea and constipation.  You will be given enough medication to last until your first postop visit in 2 weeks.  Medications WILL NOT BE REFILLED EARLY; therefore, you are to take the medications only as directed.  If you have been given multiple prescriptions, please leave them with your pharmacy.  They can keep them on file for when you need them.  Medications that are lost or stolen WILL NOT be replaced.  We will address the need for continuing certain medications on an individual basis during your postop visit.  We ask that you avoid over the counter anti-inflammatory medications (Advil, Aleve, Motrin) for 3 months.    What you can expect following neck surgery... It is not uncommon to experience a sore throat or difficulty swallowing following neck surgery.  Cold liquids and soft foods are helpful in soothing this discomfort.  There is no specific diet that you are to follow after surgery, however, there are a  few things you should keep in mind to avoid unneeded discomfort.  Take small bites and eat slowly.  Chew your food thoroughly before swallowing.   It is not uncommon to experience incisional soreness or pain in the back of the neck, shoulders or between the shoulder blades.  These symptoms will slowly begin to resolve as you continue to recover, however, they can last for a few weeks.    It is not uncommon to experience INTERMITTENT arm pain following surgery.  This pain can mimic the arm pain you had prior to surgery.  As long as the pain resolves on its own and is not constant, there is no need to become alarmed.    When To Call If you experience fever >101F, loss of bowel or bladder control, painful swelling in the lower extremities, constant (unresolving) arm pain.  If you experience any of these symptoms, please call [place] [phone].  What's Next As mentioned earlier, you will follow up with Dr. Rolena Infante in 2 weeks.  At that time, we will likely remove your stitches and discuss additional aspects of your recovery.    RESTART XARELTO ON SUNDAY 07/23/22               ACTIVITY GUIDELINES ANTERIOR CERVICAL DISECTOMY AND FUSION  Activity Discharge 2 weeks 6 weeks 3 months 6 months 1 year  Shower 5 days        Submerge the wound  no no yes     Walking outdoors yes       Lifting 5 lbs yes       Climbing stairs yes       Cooking yes       Car rides (less than 30 minutes) yes       Car rides (greater than 30 minutes) no varies yes     Air travel no varies yes     Short outings (church, visits, etc...) yes       School no no yes     Driving a car no no varies yes    Light upper extremity exercises no no varies yes    Stationary bike no no yes     Swimming (no diving) no no no varies yes   Vacuuming, laundry, mopping no no no varies yes   Biking outdoors no no no no varies yes  Light jogging no no no varies yes   Low impact aerobics no no no varies yes   Non-contact sports (tennis, golf) no no no varies yes   Hunting (no tree climbing) no no no varies yes   Dancing (non-gymnastics) no no no varies yes   Down-hill skiing (experienced skier) no no no no yes   Down-hill skiing (novice) no no no no yes   Cross-country skiing no no no no yes   Horseback riding (noncompetitive)  no no no no yes   Horseback riding (competitive) no no no no varies yes  Gardening/landscaping no no no varies yes   House repairs no no no varies varies yes  Lifting up to 50 lbs no no no no varies yes

## 2022-07-20 NOTE — Brief Op Note (Signed)
07/20/2022  5:26 PM  PATIENT:  Lysle Dingwall  67 y.o. male  PRE-OPERATIVE DIAGNOSIS:  Cervical spondylotic radiculopathy  POST-OPERATIVE DIAGNOSIS:  Cervical spondylotic radiculopathy  PROCEDURE:  Procedure(s) with comments: ANTERIOR CERVICAL DECOMPRESSION/DISCECTOMY FUSION 4 LEVELS C3-C7 (N/A) - 4.5 hrs 3 C-Bed  SURGEON:  Surgeon(s) and Role:    Melina Schools, MD - Primary  PHYSICIAN ASSISTANT:   ASSISTANTS: none   ANESTHESIA:   general  EBL:  50 mL   BLOOD ADMINISTERED:none  DRAINS: none   LOCAL MEDICATIONS USED:  MARCAINE     SPECIMEN:  No Specimen  DISPOSITION OF SPECIMEN:  N/A  COUNTS:  YES  TOURNIQUET:  * No tourniquets in log *  DICTATION: .Dragon Dictation  PLAN OF CARE: Admit to inpatient   PATIENT DISPOSITION:  PACU - hemodynamically stable.

## 2022-07-20 NOTE — Op Note (Signed)
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  7 mm lordotic small interbody cage C3-4 and C4-5.  6 mm lordotic medium cage C5-6 and C6-7.  NuVasive ACP cervical plate.  74 mm length plate that was contoured and secured with locking screws.  Graft: DX mix  BRIEF HISTORY: Spencer Cunningham is a 67 y.o. male who presented to my office with complaint of severe neck pain, radicular arm, and decreased cervical range of motion.  Imaging confirmed kyphosis of the cervical spine with multilevel degenerative disease.  MRI showed severe foraminal stenosis at multiple levels along with degenerative disease and kyphotic deformity.  Attempts at conservative management have failed to alleviate his pain or improve his quality of life.  As result we elected to move forward with surgery.  All appropriate risks benefits and alternatives to surgery were discussed patient and consent was obtained.  PROCEDURE DETAILS: Patient was brought into the operating room and was properly positioned on the operating room table.  After induction with general anesthesia the patient was endotracheally intubated.  A timeout was taken to confirm all important data: including patient, procedure, and the level. Teds, SCD's were applied.   The anterior cervical spine was prepped and draped in a standard fashion.  Using fluoroscopy I marked out the C3-4 and the C6-7 disc spaces and then drew out my longitudinal left-sided incision.  The incision was infiltrated with quarter percent Marcaine with epinephrine.  A standard Smith-Robinson approach via longitudinal incision  was then performed.  I incised the skin and then dissected down to and through the platysma.  I sharply dissected along the avascular plane until the omohyoid was visible.  I isolated and removed resected the omohyoid for improved visualization.  I then continued dissecting sharply down to the prevertebral fascia.  Using Kitner dissectors I mobilized the prevertebral fascia to expose the anterior longitudinal ligament from the mid body of C3 to the mid body of C7.  There were large anterior bridging exostosis primarily at C5-6 and C6-7.  These were isolated and resected with Kerrison rongeurs.  A needle was placed into the C3-4 disc space and an x-ray was taken to confirm the appropriate level.  I marked the disc space.  Using Electrocautery I mobilized the longus coli muscle from the mid body of C3 to the mid body of C7 bilaterally.  Anterior exostoses were removed and the Caspar self-retaining retractor blades were placed underneath the longus coli muscle and I expanded the retractor to the appropriate width.  Annulotomy was performed at C3-4 and I remove the bulk of the disc material.  Distraction pins were then placed into the body of C3 and C4 and I gently distracted the intervertebral space and maintained with the distraction pin set.  I continued working posteriorly using curettes to remove all of the disc material.  Of nerve hook was then used to pass underneath the posterior aspect of the vertebral body and developed a plane underneath the posterior annulus.  The posterior annulus was then resected with the #1 Kerrison rongeur.  I then dissected through the posterior longitudinal ligament and resected this with a 1 mm Kerrison  rongeur.  I can now easily pass my nerve hook behind the vertebral body of C3 and C4.  I then undercut the uncovertebral joints to further decompress the nerve roots.  At this point I irrigated the wound copiously normal saline and rasped the endplates to bleeding subchondral bone.   I then placed the trialing devices and elected to use the 7 mm small lordotic implant.  The implant was obtained and packed with the allograft and malleted to the appropriate depth.  I then repositioned the distraction pins to expose the C4-5 disc space level.  The retractor was also repositioned to better expose this level.  Using the exact same technique I performed a discectomy at C4-5.  I resected all of the disc material and then trim down the posterior annulus and undercut the uncovertebral joints to further decompress the nerve root.  The posterior annulus and PLL were also resected with a 1 mm Kerrison rongeur.  I confirmed using fluoroscopy that my nerve root was freely able to pass under the uncovertebral joints and vertebral bodies.  I then rasped the endplates and then moved forward to performing a discectomy at C5-6.  I again used the same technique I had utilized at C4-5.  A complete discectomy was performed and the posterior annulus and posterior longitudinal ligament were also resected in a similar fashion..  The uncovertebral joints were also decompressed to further make room for the nerve roots.  I rasped the endplates and then repositioned to expose C6-7.  The C6-7 discectomy was completed in the same fashion as had been done at the previous levels.  At this point all 3 discectomies were completed.  I then placed the trialing implants and elected to use the 6 mm medium implants at C5-6 and C6-7 and a 7 mm small implant at C4-5.  These implants corrected the kyphotic deformity and provided excellent distraction of the disc spaces.  The implants were obtained and packed with the DBX mix and malleted to the appropriate depth.  All of the implants were countersunk to allow for additional graft to be placed anteriorly.  Once all 4 cages were properly positioned I irrigated the wound copiously with normal saline.  I then placed additional bone graft anterior to each of the cages and then contoured a  74 mm length plate.  The plate was secured with a single 17 and 15 mm length screw into the body of C3 and C7.  The remainder of the plate was secured with 15 mm length screws into the bodies of C4, 5, and 6.  All 10 screws had excellent purchase.  They were all tightened and the locking mechanism for the plate was engaged.  At this point the retractors were removed and x-rays were taken.  I had satisfactory correction of the kyphotic deformity and positioning of the intervertebral cages and the anterior plate.  The wound was then irrigated and hemostasis was confirmed.  I then closed the platysma with interrupted 2-0 Vicryl suture and a 3-0 Monocryl for the skin.  Steri-Strips and dry dressings were applied and the patient was ultimately extubated transfer the PACU without incident.  The end of the case all needle sponge counts were correct.  There were no adverse intraoperative events.  Melina Schools, MD 07/20/2022 5:15 PM

## 2022-07-20 NOTE — H&P (Signed)
History: Spencer Cunningham is a very pleasant 67 year old gentleman with severe neck and radicular arm pain. Imaging study demonstrates multilevel degenerative disease with foraminal and lateral recess stenosis causing nerve compression. As result we have elected to move forward with a C3-7 ACDF.  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   Current Outpatient Medications on File Prior to Encounter  Medication Sig Dispense Refill   atorvastatin (LIPITOR) 40 MG tablet Take 40 mg by mouth in the morning.     DULoxetine (CYMBALTA) 60 MG capsule Take 60 mg by mouth in the morning.     rivaroxaban (XARELTO) 20 MG TABS tablet Take 20 mg by mouth every evening.     traZODone (DESYREL) 100 MG tablet Take 100 mg by mouth at bedtime as needed for sleep.     albuterol (VENTOLIN HFA) 108 (90 Base) MCG/ACT inhaler Inhale 1-2 puffs into the lungs every 6 (six) hours as needed for wheezing or shortness of breath. (Patient not taking: Reported on 07/11/2022) 1 each 0    Physical Exam: Vitals:   07/20/22 0913  BP: (!) 141/83  Pulse: 62  Resp: 18  Temp: 97.9 F (36.6 C)  SpO2: 95%   Body mass index is 28.06 kg/m.  Clinical exam: Spencer Cunningham is a pleasant individual, who appears younger than their stated age.  He is alert and orientated 3.  No shortness of breath, chest pain.  Abdomen is soft and non-tender, negative loss of bowel and bladder control, no rebound  tenderness.  Negative: skin lesions abrasions contusions  Peripheral pulses: 2+ peripheral pulses in the upper extremity bilaterally. LE compartments are: Soft and nontender.  Gait pattern: normal  Assistive devices: none  Neuro: 3+/5 right deltoid, biceps, triceps strength. 5/5 grip strength. 5/5 motor strength in the left upper extremity. Positive numbness and dysesthesias into the lateral aspect of the cervical spine over the trapezius and into the right upper extremity. Positive Spurling sign with reproduction of right radicular arm pain. Negative Babinski test, negative Hoffman test, negative Lhermitte sign. 1+ symmetrical deep tendon reflexes bilaterally.  Musculoskeletal: Well-healed surgical scar from previous posterior cervical discectomy/decompression. Severe neck pain with range of motion especially attempts at extension or rotation. Patient maintains forward flexed kyphotic posture to the cervical spine. Positive occipital headaches. Positive crepitus. Patient has chronic right anterior shoulder pain from failed rotator cuff surgery several years ago. He has at baseline limited range of motion of the right shoulder due to the rotator cuff issue.  Imaging: Lateral x-rays of the cervical spine demonstrate overall kyphosis C7 of approximately 20 degrees. With maximum extension of the cervical spine cervical kyphosis from C3 decreases to 12 degrees. Patient has advanced degenerative disc disease C5-6 and C6-7, and moderate degenerative disease C4-5 and to a lesser degree C3-4.  Cervical MRI: completed on 06/29/2022. No cord signal changes.  Overall cervical kyphosis is noted with multilevel disc space narrowing with prominent osteophytes throughout the cervical spine. 2 mm anterior listhesis at C3-4 with severe right neuroforaminal narrowing affecting the exiting C4 nerve root. Large right posterior lateral osteophyte at C4-5 with moderate degenerative disc disease. Positive compression to the  exiting C5 nerve root. Advanced degenerative disc disease C5-6 and C6-7 with mild central but severe foraminal stenosis bilaterally. C7-T1: 2 mm anterior listhesis with mild central stenosis but significant foraminal stenosis on the right side.   A/P: Summary: Spencer Cunningham is a very pleasant 67 year old gentleman with progressive debilitating neck and radicular right arm pain. The patient's clinical exam suggests weakness of the deltoid, bicep, and tricep on the right side. This would correlate with the C5, C6, and C7 nerve roots. In addition he is having severe neck pain which is most likely due to the kyphotic deformity of the cervical spine, multilevel degenerative disc disease but also could be related to C4 nerve irritation. Imaging studies clearly demonstrate significant cervical kyphosis of 20 degrees. Fortunately despite the degenerative changes he does not have any signs or symptoms of myelopathy.   Given the severity of his kyphosis and his previous posterior surgery I think the best option is a multilevel anterior cervical discectomy and fusion. While the patient does have degenerative disease at C7-T1 he is not complaining of grip strength weakness or significant C8 radicular pain or numbness. His primary issue is neck pain radiating into the C5,C6, and C7 dermatomes.   At this point I think the best course of action is a 4 level ACDF C3-7. This would allow restoration of his cervical lordosis for at least returning into the neutral position. It would also allow indirect foraminal decompression to improve the nerve root function. I have gone over the surgical procedure in great detail with the patient and all of his questions were addressed. The goal of surgery is reduction in his pain and improvement in his quality of life.   Risks and benefits of surgery were discussed with the patient. These include: Infection, bleeding, death, stroke, paralysis, ongoing or worse pain, need for additional surgery,  nonunion, leak of spinal fluid, adjacent segment degeneration requiring additional fusion surgery. Pseudoarthrosis (nonunion)requiring supplemental posterior fixation. Throat pain, swallowing difficulties, hoarseness or change in voice.

## 2022-07-20 NOTE — Anesthesia Procedure Notes (Addendum)
Procedure Name: Intubation Date/Time: 07/20/2022 11:42 AM  Performed by: Reggie Pile, CRNAPre-anesthesia Checklist: Patient identified, Emergency Drugs available, Suction available and Patient being monitored Patient Re-evaluated:Patient Re-evaluated prior to induction Oxygen Delivery Method: Circle system utilized Preoxygenation: Pre-oxygenation with 100% oxygen Induction Type: IV induction Ventilation: Mask ventilation without difficulty Laryngoscope Size: Glidescope and 3 Grade View: Grade II Tube type: Oral Tube size: 7.0 mm Number of attempts: 1 Airway Equipment and Method: Oral airway and Rigid stylet Placement Confirmation: ETT inserted through vocal cords under direct vision, positive ETCO2 and breath sounds checked- equal and bilateral Secured at: 22 cm Tube secured with: Tape Dental Injury: Teeth and Oropharynx as per pre-operative assessment  Comments: History of throat cancer with radiation and chemotherapy. Small mouth opening. Bilateral numbness in both arms and severe pain that radiates from the neck to the shoulders and down his arms.. Positioned pt in neutral and comfortable position for glidescope intubation and maintained that position for intubation.

## 2022-07-20 NOTE — Anesthesia Preprocedure Evaluation (Signed)
Anesthesia Evaluation  Patient identified by MRN, date of birth, ID band Patient awake    Reviewed: Allergy & Precautions, NPO status , Patient's Chart, lab work & pertinent test results  History of Anesthesia Complications Negative for: history of anesthetic complications  Airway Mallampati: III  TM Distance: >3 FB Neck ROM: Limited    Dental  (+) Dental Advisory Given, Teeth Intact   Pulmonary neg shortness of breath, neg COPD, neg recent URI, former smoker, PE   breath sounds clear to auscultation       Cardiovascular negative cardio ROS  Rhythm:Regular     Neuro/Psych negative neurological ROS  negative psych ROS   GI/Hepatic negative GI ROS, Neg liver ROS,,,  Endo/Other  negative endocrine ROS    Renal/GU negative Renal ROSLab Results      Component                Value               Date                      CREATININE               0.85                07/14/2022                Musculoskeletal  (+) Arthritis ,    Abdominal   Peds  Hematology  (+) Blood dyscrasia Lab Results      Component                Value               Date                      WBC                      7.3                 07/14/2022                HGB                      15.5                07/14/2022                HCT                      45.9                07/14/2022                MCV                      97.7                07/14/2022                PLT                      208                 07/14/2022             Xarelton held x 5 days   Anesthesia Other Findings   Reproductive/Obstetrics  Anesthesia Physical Anesthesia Plan  ASA: 2  Anesthesia Plan: General   Post-op Pain Management: Ofirmev IV (intra-op)* and Toradol IV (intra-op)*   Induction: Intravenous  PONV Risk Score and Plan: 2 and Ondansetron and Dexamethasone  Airway Management Planned: Oral ETT and  Video Laryngoscope Planned  Additional Equipment: None  Intra-op Plan:   Post-operative Plan: Extubation in OR  Informed Consent: I have reviewed the patients History and Physical, chart, labs and discussed the procedure including the risks, benefits and alternatives for the proposed anesthesia with the patient or authorized representative who has indicated his/her understanding and acceptance.     Dental advisory given  Plan Discussed with: CRNA  Anesthesia Plan Comments:        Anesthesia Quick Evaluation

## 2022-07-21 NOTE — Evaluation (Signed)
Occupational Therapy Evaluation Patient Details Name: Spencer Cunningham MRN: [id number] DOB: [birth year] Today's Date: 07/21/2022   History of Present Illness 67 y.o. male who was admitted 07/20/2022 for operative treatment of Cervical radiculopathy. S/p  anterior cervical decompression and fusion C3-C7.   Clinical Impression   Patient evaluated by Occupational Therapy with no further acute OT needs identified. All education has been completed and the patient has no further questions. Prior to surgery, pt was living with his wife, independent with ADL tasks and functional mobility. He goes to [place] every other week to be with his father, who has dementia. No follow up OT services or DME recommended. OT is signing off. Thank you for this referral.       Recommendations for follow up therapy are one component of a multi-disciplinary discharge planning process, led by the attending physician.  Recommendations may be updated based on patient status, additional functional criteria and insurance authorization.   Assistance Recommended at Discharge PRN  Patient can return home with the following Assist for transportation;Assistance with cooking/housework    Functional Status Assessment  Patient has had a recent decline in their functional status and demonstrates the ability to make significant improvements in function in a reasonable and predictable amount of time.  Equipment Recommendations  None recommended by OT       Precautions / Restrictions Precautions Precautions: Cervical Precaution Booklet Issued: Yes (comment) Precaution Comments: provided verbal instructions and handout regarding cervical precautions Required Braces or Orthoses: Cervical Brace Cervical Brace: Hard collar Restrictions Weight Bearing Restrictions: No      Mobility Bed Mobility Overal bed mobility: Independent       Patient Response: Cooperative  Transfers Overall transfer level: Independent Equipment used:  None        Balance Overall balance assessment: No apparent balance deficits (not formally assessed), Independent     ADL either performed or assessed with clinical judgement   ADL Overall ADL's : Independent;Modified independent;At baseline           Vision Baseline Vision/History: 1 Wears glasses Ability to See in Adequate Light: 0 Adequate Patient Visual Report: No change from baseline Vision Assessment?: No apparent visual deficits            Pertinent Vitals/Pain Pain Assessment Pain Assessment: 0-10 Pain Score:  (no number provided) Pain Location: neck Pain Descriptors / Indicators: Sore, Operative site guarding Pain Intervention(s): Monitored during session     Hand Dominance Right   Extremity/Trunk Assessment Upper Extremity Assessment Upper Extremity Assessment: Overall WFL for tasks assessed   Lower Extremity Assessment Lower Extremity Assessment: Overall WFL for tasks assessed   Cervical / Trunk Assessment Cervical / Trunk Assessment: Neck Surgery   Communication Communication Communication: No difficulties   Cognition Arousal/Alertness: Awake/alert Behavior During Therapy: WFL for tasks assessed/performed Overall Cognitive Status: Within Functional Limits for tasks assessed                  Home Living Family/patient expects to be discharged to:: Private residence Living Arrangements: Spouse/significant other Available Help at Discharge: Family;Available 24 hours/day Type of Home: House Home Access: Stairs to enter [employer] of Steps: 1 through garage entrance Entrance Stairs-Rails: None Home Layout: Two level;Able to live on main level with bedroom/bathroom     Bathroom Shower/Tub: Walk-in shower         Home Equipment: Grab bars - tub/shower;Grab bars - toilet          Prior Functioning/Environment Prior Level of Function : Independent/Modified Independent;Driving  ADLs Comments: Goes to [facility] every other week  to help with his Father who has dementia. Has around the clock nursing care so he does not need to provide physical assistance.        OT Problem List: Decreased strength      OT Treatment/Interventions:   Self care management   OT Goals(Current goals can be found in the care plan section) Acute Rehab OT Goals Patient Stated Goal: to recover fast so he can go to [place].  OT Frequency:  1X time visit       AM-PAC OT "6 Clicks" Daily Activity     Outcome Measure Help from another person eating meals?: None Help from another person taking care of personal grooming?: None Help from another person toileting, which includes using toliet, bedpan, or urinal?: None Help from another person bathing (including washing, rinsing, drying)?: None Help from another person to put on and taking off regular upper body clothing?: None Help from another person to put on and taking off regular lower body clothing?: None 6 Click Score: 24   End of Session Nurse Communication: Mobility status  Activity Tolerance: Patient tolerated treatment well Patient left: in bed;with call bell/phone within reach  OT Visit Diagnosis: Muscle weakness (generalized) (M62.81)                Time: :[id number] OT Time Calculation (min): 22 min Charges:  OT General Charges $OT Visit: 1 Visit OT Evaluation $OT Eval Moderate Complexity: 1 Mod OT Treatments $Self Care/Home Management : 8-22 mins  Ailene Ravel, OTR/L,CBIS  Supplemental OT - [facility] and [facility] Secure Chat Preferred    Taite Baldassari, Clarene Duke 07/21/2022, 9:47 AM

## 2022-07-21 NOTE — Progress Notes (Signed)
Patient alert and oriented, voiding adequately, MAE well with no difficulty. Incision area cdi with no s/s of infection. Patient discharged home per order. Patient and husband stated understanding of discharge instructions given. Patient has an appointment with Dr. Brooks in 2 weeks 

## 2022-07-21 NOTE — Discharge Summary (Signed)
Patient ID: Spencer Cunningham MRN: [id number] DOB/AGE: Oct 02, 1955 67 y.o.  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 male who was admitted 07/20/2022 for operative treatment of Cervical radiculopathy. Patient failed conservative treatments (please see the history and physical for the specifics) and had severe unremitting pain that affects sleep, daily activities and work/hobbies. After pre-op clearance, the patient was taken to the operating room on 07/20/2022 and underwent  Procedure(s): ANTERIOR CERVICAL DECOMPRESSION/DISCECTOMY FUSION 4 LEVELS C3-C7.    Patient was given perioperative antibiotics:  Anti-infectives (From admission, onward)    Start     Dose/Rate Route Frequency Ordered Stop   07/20/22 2200  ceFAZolin (ANCEF) IVPB 1 g/50 mL premix        1 g 100 mL/hr over 30 Minutes Intravenous Every 8 hours 07/20/22 1828 07/21/22 0603        Patient was given sequential compression devices and early ambulation to prevent DVT.   Patient benefited maximally from hospital stay and there were no complications. At the time of discharge, the  patient was urinating/moving their bowels without difficulty, tolerating a regular diet, pain is controlled with oral pain medications and they have been cleared by PT/OT.   Recent vital signs: Patient Vitals for the past 24 hrs:  BP Temp Temp src Pulse Resp SpO2 Height Weight  07/21/22 0404 (!) 143/90 98.2 F (36.8 C) Oral 84 18 98 % -- --  07/21/22 0158 (!) 153/90 98.1 F (36.7 C) Oral 83 18 97 % -- --  07/20/22 2357 135/85 98.6 F (37 C) Oral 80 18 97 % -- --  07/20/22 2158 127/77 98.5 F (36.9 C) Oral 80 18 97 % -- --  07/20/22 1954 (!) 142/79 98.5 F (36.9 C) Oral 78 18 98 % -- --  07/20/22 1846 137/84 98.2 F (36.8 C) Oral 76 16 99 % -- --  07/20/22 1815 121/77 98.3 F (36.8 C) -- 83 12 93 % -- --  07/20/22 1800 139/75 -- -- 83 11 94 % -- --  07/20/22 1754 -- -- -- 83 11 94 % -- --  07/20/22 1745 138/81 -- -- 83 16 92 % -- --  07/20/22 1730 (!) 146/79 -- -- 88 20 97 % -- --  07/20/22 1719 (!) 144/80 98.3 F (36.8 C) -- 88 20 97 % -- --  07/20/22 0913 (!) 141/83 97.9 F (36.6 C) Oral 62 18 95 % 5\' 9"  (1.753 m) 86.2 kg     Recent laboratory studies: No results for input(s): "WBC", "HGB", "HCT", "PLT", "NA", "K", "CL", "CO2", "BUN", "CREATININE", "GLUCOSE", "INR", "CALCIUM" in the last 72 hours.  Invalid input(s): "PT", "2"   Discharge Medications:   Allergies as of 07/21/2022       Reactions   Penicillins Other (See Comments)   Childhood allergy., rash        Medication List     TAKE these medications    albuterol 108 (90 Base) MCG/ACT inhaler Commonly known as: VENTOLIN HFA Inhale 1-2 puffs into the lungs every 6 (six) hours as needed for wheezing or shortness of breath.   atorvastatin 40 MG tablet Commonly known as: LIPITOR Take 40 mg by mouth in the morning.   DULoxetine 60 MG capsule Commonly known as: CYMBALTA Take 60 mg by mouth in the morning.   methocarbamol 500 MG tablet Commonly known as: ROBAXIN Take 1 tablet (500 mg total) by mouth every 8  (eight) hours as needed for up to 5 days for muscle spasms.   ondansetron 4 MG tablet Commonly known as: Zofran Take 1 tablet (4 mg total) by mouth every 8 (eight) hours as needed for nausea or vomiting.   oxyCODONE-acetaminophen 10-325 MG tablet Commonly known as: Percocet Take 1 tablet by mouth every 6 (six) hours as needed for up to 5 days for pain.   rivaroxaban 20 MG Tabs tablet Commonly known as: XARELTO Take 20 mg by mouth every evening.   traZODone 100 MG tablet Commonly known as: DESYREL Take 100 mg by mouth at bedtime as needed for sleep.        Diagnostic Studies: DG Cervical Spine 2 or 3 views  Result Date: 07/20/2022 CLINICAL DATA:  ACDF C3-C7 EXAM: CERVICAL SPINE - 2-3 VIEW COMPARISON:  None Available. FINDINGS: Four fluoroscopic images are obtained during the performance of the procedure and are provided for interpretation only. Images demonstrate ACDF of C3-C7, with interbody disc spacers at each level. Fluoroscopy time: 96.5 seconds Cumulative air kerma: 14.13 mGy IMPRESSION: Four fluoroscopic images are obtained during the performance of the procedure and are provided for interpretation only. Electronically Signed   By: Merilyn Baba M.D.   On: 07/20/2022 17:15   DG C-Arm 1-60 Min-No Report  Result Date: 07/20/2022 Fluoroscopy was utilized by the requesting physician.  No radiographic interpretation.   DG C-Arm 1-60 Min-No Report  Result Date: 07/20/2022 Fluoroscopy was utilized by the requesting physician.  No radiographic interpretation.   DG C-Arm 1-60 Min-No Report  Result Date: 07/20/2022 Fluoroscopy was utilized by the requesting physician.  No radiographic interpretation.   DG C-Arm 1-60 Min-No Report  Result Date: 07/20/2022 Fluoroscopy was utilized by the requesting physician.  No radiographic interpretation.   DG C-Arm 1-60 Min-No Report  Result Date: 07/20/2022 Fluoroscopy was utilized by the requesting physician.  No radiographic  interpretation.   DG C-Arm 1-60 Min-No Report  Result Date: 07/20/2022 Fluoroscopy was utilized by the requesting physician.  No radiographic interpretation.    Discharge Instructions     Incentive spirometry RT   Complete by: As directed         Follow-up Information     Melina Schools, MD. Schedule an appointment as soon as possible for a visit in 2 week(s).   Specialty: Orthopedic Surgery Why: If symptoms worsen, For wound re-check, For suture removal Contact information: [address] [place] [address] [phone]                 Discharge Plan:  discharge to home  Disposition: Patient is doing exceptionally well status post 4 level ACDF he has no shortness of breath difficulty breathing, or  swelling at the incision site.  Patient is tolerating p.o. diet and his pain is well-controlled with oral medications.  Patient has been voiding spontaneously and having positive flatus.  Overall patient has done exceptionally well.  He has been ambulating with no assistance with no focal neurological deficits.  Plan on discharge to home.  He will restart his anticoagulation on Sunday, 07/23/2022.  He will follow-up with me in 2 weeks.  If he has any shortness of breath, swelling at the incision site, or difficulty breathing he knows to immediately either go to the urgent care or emergency room for evaluation.  All appropriate discharge instructions have been provided as have medications.    Signed: Dahlia Bailiff for Dr. Melina Schools [facility] [phone] 07/21/2022, 7:06 AM

## 2022-07-21 NOTE — Progress Notes (Signed)
PT Cancellation Note and Discharge  Patient Details Name: Spencer Cunningham MRN: [id number] DOB: 08/17/1955   Cancelled Treatment:    Reason Eval/Treat Not Completed: PT screened, no needs identified, will sign off. Discussed pt case with OT who reports pt is currently mobilizing at a modified independent level and does not require a formal PT evaluation at this time. PT signing off. If needs change, please reconsult.     Thelma Comp 07/21/2022, 10:29 AM  Rolinda Roan, PT, DPT Acute Rehabilitation Services Secure Chat Preferred Office: [phone]

## 2022-07-21 NOTE — Anesthesia Postprocedure Evaluation (Signed)
Anesthesia Post Note  Patient: Spencer Cunningham  Procedure(s) Performed: ANTERIOR CERVICAL DECOMPRESSION/DISCECTOMY FUSION 4 LEVELS C3-C7     Patient location during evaluation: Other Anesthesia Type: General Level of consciousness: awake and alert Pain management: pain level controlled Vital Signs Assessment: post-procedure vital signs reviewed and stable Respiratory status: spontaneous breathing, nonlabored ventilation and respiratory function stable Cardiovascular status: blood pressure returned to baseline and stable Postop Assessment: no apparent nausea or vomiting Anesthetic complications: no  No notable events documented.  Last Vitals:  Vitals:   07/21/22 0404 07/21/22 0735  BP: (!) 143/90 114/71  Pulse: 84 80  Resp: 18 20  Temp: 36.8 C 36.7 C  SpO2: 98% 94%    Last Pain:  Vitals:   07/21/22 0735  TempSrc: Oral  PainSc:                  Yamile Roedl,W. EDMOND

## 2022-07-25 ENCOUNTER — Encounter (HOSPITAL_COMMUNITY): Payer: Self-pay | Admitting: Orthopedic Surgery

## 2022-07-27 ENCOUNTER — Emergency Department (HOSPITAL_BASED_OUTPATIENT_CLINIC_OR_DEPARTMENT_OTHER): Payer: Medicare Other

## 2022-07-27 ENCOUNTER — Emergency Department (HOSPITAL_BASED_OUTPATIENT_CLINIC_OR_DEPARTMENT_OTHER)
Admission: EM | Admit: 2022-07-27 | Discharge: 2022-07-27 | Disposition: A | Discharge status: Home / Self Care | Payer: Medicare Other | Attending: Emergency Medicine | Admitting: Emergency Medicine

## 2022-07-27 ENCOUNTER — Other Ambulatory Visit: Payer: Self-pay

## 2022-07-27 ENCOUNTER — Encounter (HOSPITAL_BASED_OUTPATIENT_CLINIC_OR_DEPARTMENT_OTHER): Payer: Self-pay | Admitting: Emergency Medicine

## 2022-07-27 DIAGNOSIS — R0600 Dyspnea, unspecified: Secondary | ICD-10-CM | POA: Diagnosis not present

## 2022-07-27 DIAGNOSIS — M542 Cervicalgia: Secondary | ICD-10-CM | POA: Diagnosis present

## 2022-07-27 DIAGNOSIS — Z86711 Personal history of pulmonary embolism: Secondary | ICD-10-CM | POA: Insufficient documentation

## 2022-07-27 DIAGNOSIS — Z7901 Long term (current) use of anticoagulants: Secondary | ICD-10-CM | POA: Insufficient documentation

## 2022-07-27 DIAGNOSIS — I2693 Single subsegmental pulmonary embolism without acute cor pulmonale: Secondary | ICD-10-CM | POA: Diagnosis not present

## 2022-07-27 LAB — TROPONIN I (HIGH SENSITIVITY): Troponin I (High Sensitivity): 5 ng/L (ref <18)

## 2022-07-27 LAB — CBC WITH DIFFERENTIAL/PLATELET
Abs Immature Granulocytes: 0.04 10*3/uL (ref 0.00–0.07)
Basophils Absolute: 0 10*3/uL (ref 0.0–0.1)
Basophils Relative: 0 %
Eosinophils Absolute: 0.1 10*3/uL (ref 0.0–0.5)
Eosinophils Relative: 2 %
HCT: 45.7 % (ref 39.0–52.0)
Hemoglobin: 15.4 g/dL (ref 13.0–17.0)
Immature Granulocytes: 1 %
Lymphocytes Relative: 17 %
Lymphs Abs: 1.2 10*3/uL (ref 0.7–4.0)
MCH: 32.5 pg (ref 26.0–34.0)
MCHC: 33.7 g/dL (ref 30.0–36.0)
MCV: 96.4 fL (ref 80.0–100.0)
Monocytes Absolute: 0.8 10*3/uL (ref 0.1–1.0)
Monocytes Relative: 12 %
Neutro Abs: 4.9 10*3/uL (ref 1.7–7.7)
Neutrophils Relative %: 68 %
Platelets: 280 10*3/uL (ref 150–400)
RBC: 4.74 MIL/uL (ref 4.22–5.81)
RDW: 13.4 % (ref 11.5–15.5)
WBC: 7.1 10*3/uL (ref 4.0–10.5)
nRBC: 0 % (ref 0.0–0.2)

## 2022-07-27 LAB — COMPREHENSIVE METABOLIC PANEL
ALT: 7 U/L (ref 0–44)
AST: 15 U/L (ref 15–41)
Albumin: 4 g/dL (ref 3.5–5.0)
Alkaline Phosphatase: 51 U/L (ref 38–126)
Anion gap: 7 (ref 5–15)
BUN: 13 mg/dL (ref 8–23)
CO2: 29 mmol/L (ref 22–32)
Calcium: 9.6 mg/dL (ref 8.9–10.3)
Chloride: 103 mmol/L (ref 98–111)
Creatinine, Ser: 0.81 mg/dL (ref 0.61–1.24)
GFR, Estimated: >60 mL/min (ref >60)
Glucose, Bld: 107 mg/dL — ABNORMAL HIGH (ref 70–99)
Potassium: 4.4 mmol/L (ref 3.5–5.1)
Sodium: 139 mmol/L (ref 135–145)
Total Bilirubin: 0.8 mg/dL (ref 0.3–1.2)
Total Protein: 7.1 g/dL (ref 6.5–8.1)

## 2022-07-27 LAB — BRAIN NATRIURETIC PEPTIDE: B Natriuretic Peptide: 19.6 pg/mL (ref 0.0–100.0)

## 2022-07-27 MED ORDER — IOHEXOL 350 MG/ML SOLN
100.0000 mL | Freq: Once | INTRAVENOUS | Status: AC | PRN
  Administered 2022-07-27: 75 mL via INTRAVENOUS

## 2022-07-27 MED ORDER — ONDANSETRON HCL 4 MG/2ML IJ SOLN
4.0000 mg | Freq: Once | INTRAMUSCULAR | Status: AC
  Administered 2022-07-27: 4 mg via INTRAVENOUS
  Filled 2022-07-27: qty 2

## 2022-07-27 MED ORDER — RIVAROXABAN 15 MG PO TABS
15.0000 mg | ORAL_TABLET | Freq: Once | ORAL | Status: AC
  Administered 2022-07-27: 15 mg via ORAL
  Filled 2022-07-27: qty 1

## 2022-07-27 MED ORDER — ALUM & MAG HYDROXIDE-SIMETH 200-200-20 MG/5ML PO SUSP
30.0000 mL | Freq: Once | ORAL | Status: AC
  Administered 2022-07-27: 30 mL via ORAL
  Filled 2022-07-27: qty 30

## 2022-07-27 MED ORDER — RIVAROXABAN 15 MG PO TABS: 15.0000 mg | ORAL_TABLET | Freq: Two times a day (BID) | ORAL | 0 refills | Status: DC

## 2022-07-27 MED ORDER — MORPHINE SULFATE (PF) 4 MG/ML IV SOLN
4.0000 mg | Freq: Once | INTRAVENOUS | Status: AC
  Administered 2022-07-27: 4 mg via INTRAVENOUS
  Filled 2022-07-27: qty 1

## 2022-07-27 MED ORDER — SODIUM CHLORIDE 0.9 % IV BOLUS
1000.0000 mL | Freq: Once | INTRAVENOUS | Status: AC
  Administered 2022-07-27: 1000 mL via INTRAVENOUS

## 2022-07-27 MED ORDER — MORPHINE SULFATE 15 MG PO TABS: 7.5000 mg | ORAL_TABLET | ORAL | 0 refills | Status: DC | PRN

## 2022-07-27 NOTE — ED Notes (Signed)
EDP at [facility] 

## 2022-07-27 NOTE — Discharge Instructions (Addendum)
Information on my medicine - XARELTO (rivaroxaban)  This medication education was reviewed with me or my healthcare representative as part of my discharge preparation.   WHY WAS XARELTO PRESCRIBED FOR YOU? Xarelto was prescribed to treat blood clots that may have been found in the veins of your legs (deep vein thrombosis) or in your lungs (pulmonary embolism) and to reduce the risk of them occurring again.  What do you need to know about Xarelto? The starting dose is one 15 mg tablet taken TWICE daily with food for the FIRST 21 DAYS then the dose is changed to one 20 mg tablet taken ONCE A DAY with your evening meal.  DO NOT stop taking Xarelto without talking to the health care provider who prescribed the medication.  Refill your prescription for 20 mg tablets before you run out.  After discharge, you should have regular check-up appointments with your healthcare provider that is prescribing your Xarelto.  In the future your dose may need to be changed if your kidney function changes by a significant amount.  What do you do if you miss a dose? If you are taking Xarelto TWICE DAILY and you miss a dose, take it as soon as you remember. You may take two 15 mg tablets (total 30 mg) at the same time then resume your regularly scheduled 15 mg twice daily the next day.  If you are taking Xarelto ONCE DAILY and you miss a dose, take it as soon as you remember on the same day then continue your regularly scheduled once daily regimen the next day. Do not take two doses of Xarelto at the same time.   Important Safety Information Xarelto is a blood thinner medicine that can cause bleeding. You should call your healthcare provider right away if you experience any of the following: Bleeding from an injury or your nose that does not stop. Unusual colored urine (red or dark brown) or unusual colored stools (red or black). Unusual bruising for unknown reasons. A serious fall or if you hit your head  (even if there is no bleeding).  Some medicines may interact with Xarelto and might increase your risk of bleeding while on Xarelto. To help avoid this, consult your healthcare provider or pharmacist prior to using any new prescription or non-prescription medications, including herbals, vitamins, non-steroidal anti-inflammatory drugs (NSAIDs) and supplements.  This website has more information on Xarelto: [email or web address]-benson.[email or web address]  Return for worsening symptoms, numbness weakness to the arms worsening difficulty breathing worsening neck pain.  Take tylenol 1000mg (2 extra strength) four times a day.   Then take the pain medicine if you feel like you need it. Narcotics do not help with the pain, they only make you care about it less.  You can become addicted to this, people may break into your house to steal it.  It will constipate you.  If you drive under the influence of this medicine you can get a DUI.

## 2022-07-27 NOTE — ED Triage Notes (Signed)
C4 -5,6,7 diskectomy, and had artificial placed on Thursday. 2 days started hard time breathing, pain neck,shoulders, ,hurts worse when takes deep breath. Has not spoken with  surgeon of coarse. Hx PE , DVT. On xarelto, started xarelto back on Saturday night.

## 2022-07-27 NOTE — ED Provider Notes (Signed)
[facility] Provider Note   CSN: [id number] Arrival date & time: 07/27/22  0757     History  No chief complaint on file.   Spencer Cunningham is a 67 y.o. male.  67 yo M with a chief complaints of neck pain.  The patient was having chronic neck pain and had a surgical procedure done about a week ago.  He had initial significant improvement with his neck symptoms and then about 2 days ago started having recurrence.  There feels like he had a heaviness on his chest and was having difficulty breathing.  Pain seems occur with deep breathing.  He has a history of a PE but does not remember if it feels similar or not.  When he had discussed his history of PE with his spinal surgeon they encouraged him if he had any symptoms concerning to come to the ER to be evaluated.  He denies fevers denies cough.        Home Medications Prior to Admission medications   Medication Sig Start Date End Date Taking? Authorizing Provider  morphine (MSIR) 15 MG tablet Take 0.5 tablets (7.5 mg total) by mouth every 4 (four) hours as needed for severe pain. 07/27/22  Yes Deno Etienne, DO  Rivaroxaban (XARELTO) 15 MG TABS tablet Take 1 tablet (15 mg total) by mouth 2 (two) times daily for 21 days. 07/27/22 08/17/22 Yes Deno Etienne, DO  albuterol (VENTOLIN HFA) 108 (90 Base) MCG/ACT inhaler Inhale 1-2 puffs into the lungs every 6 (six) hours as needed for wheezing or shortness of breath. Patient not taking: Reported on 07/11/2022 03/16/22 04/15/22  Audley Hose, MD  atorvastatin (LIPITOR) 40 MG tablet Take 40 mg by mouth in the morning. 03/08/22   [provider]  DULoxetine (CYMBALTA) 60 MG capsule Take 60 mg by mouth in the morning.    [provider]  ondansetron (ZOFRAN) 4 MG tablet Take 1 tablet (4 mg total) by mouth every 8 (eight) hours as needed for nausea or vomiting. 07/20/22   Melina Schools, MD  rivaroxaban (XARELTO) 20 MG TABS tablet Take 20 mg by mouth  every evening.    [provider]  traZODone (DESYREL) 100 MG tablet Take 100 mg by mouth at bedtime as needed for sleep.    [provider]      Allergies    Penicillins    Review of Systems   Review of Systems  Physical Exam Updated Vital Signs BP (!) 142/100   Pulse 72   Temp 98.2 F (36.8 C)   Resp 13   SpO2 91%  Physical Exam Vitals and nursing note reviewed.  Constitutional:      Appearance: He is well-developed.  HENT:     Head: Normocephalic.     Comments: The patient is in a hard collar.  He has left of midline surgical incision, clean dry and intact. Eyes:     Pupils: Pupils are equal, round, and reactive to light.  Neck:     Vascular: No JVD.  Cardiovascular:     Rate and Rhythm: Normal rate and regular rhythm.     Heart sounds: No murmur heard.    No friction rub. No gallop.  Pulmonary:     Effort: No respiratory distress.     Breath sounds: No wheezing.  Abdominal:     General: There is no distension.     Tenderness: There is no abdominal tenderness. There is no guarding or rebound.  Musculoskeletal:        General: Normal range of motion.     Cervical back: Normal range of motion and neck supple.  Skin:    Coloration: Skin is not pale.     Findings: No rash.  Neurological:     Mental Status: He is alert and oriented to person, place, and time.  Psychiatric:        Behavior: Behavior normal.     ED Results / Procedures / Treatments   Labs (all labs ordered are listed, but only abnormal results are displayed) Labs Reviewed  COMPREHENSIVE METABOLIC PANEL - Abnormal; Notable for the following components:      Result Value   Glucose, Bld 107 (*)    All other components within normal limits  CBC WITH DIFFERENTIAL/PLATELET  BRAIN NATRIURETIC PEPTIDE  TROPONIN I (HIGH SENSITIVITY)    EKG EKG Interpretation  Date/Time:  Thursday July 27 2022 08:06:44 EDT Ventricular Rate:  74 PR Interval:  175 QRS Duration: 96 QT  Interval:  387 QTC Calculation: 430 R Axis:   -31 Text Interpretation: Sinus rhythm Left axis deviation Abnormal R-wave progression, early transition Since last tracing rate slower Otherwise no significant change Confirmed by Deno Etienne [phone]) on 07/27/2022 8:11:18 AM  Radiology CT CERVICAL SPINE WO CONTRAST  Result Date: 07/27/2022 CLINICAL DATA:  Spine surgery/procedure, postop, infection suspected. Surgery 1 week ago EXAM: CT CERVICAL SPINE WITHOUT CONTRAST TECHNIQUE: Multidetector CT imaging of the cervical spine was performed without intravenous contrast. Multiplanar CT image reconstructions were also generated. RADIATION DOSE REDUCTION: This exam was performed according to the departmental dose-optimization program which includes automated exposure control, adjustment of the mA and/or kV according to patient size and/or use of iterative reconstruction technique. COMPARISON:  None Available. FINDINGS: Alignment: Facet joints are aligned without dislocation or traumatic listhesis. Dens and lateral masses are aligned. Skull base and vertebrae: Postsurgical changes from recent C3-C7 ACDF with intact hardware. Hardware is well seated. No perihardware lucency or fracture. Remaining osseous structures within normal limits without fracture or pathologic bone process. Soft tissues and spinal canal: Fluid and air collection in the prevertebral soft tissues tracking the left of the trachea and thyroid gland along the surgical incision site. Collection measures approximately 3.6 x 1.8 cm transaxially and extends approximately 6.5 cm in craniocaudal length from the C3 through T1 levels. No visible canal hematoma, although canal is largely obscured by artifact from hardware. Disc levels: Mild disc height loss at C7-T1. Degenerative facet arthropathy of multiple levels, most pronounced at C7-T1. Upper chest: Negative. Other: Bilateral carotid atherosclerosis. IMPRESSION: 1. Postsurgical changes from recent C3-C7 ACDF  with intact hardware. 2. Fluid and air collection in the prevertebral soft tissues and left neck along the surgical incision site. Collection measures approximately 3.6 x 1.8 cm transaxially and extends approximately 6.5 cm in craniocaudal length from the C3 through T1 levels. Findings may represent postoperative seroma or hematoma. An infected fluid collection would be difficult to exclude by imaging alone. Electronically Signed   By: Davina Poke D.O.   On: 07/27/2022 10:45   CT Angio Chest PE W and/or Wo Contrast  Addendum Date: 07/27/2022   ADDENDUM REPORT: 07/27/2022 10:28 ADDENDUM: This addendum is given for the purpose of noting the patient ascending thoracic aorta measures 4.3 cm. Recommend annual imaging followup by CTA or MRA. This recommendation follows 2010 ACCF/AHA/AATS/ACR/ASA/SCA/SCAI/SIR/STS/SVM Guidelines for the Diagnosis and Management of Patients with Thoracic Aortic Disease. Circulation. 2010; [id number]. Aortic aneurysm NOS (ICD10-I71.9) Electronically Signed  By: Inge Rise M.D.   On: 07/27/2022 10:28   Result Date: 07/27/2022 CLINICAL DATA:  Patient status post cervical spine surgery 07/20/2022. Onset difficulty breathing with neck, chest and shoulder pain 2 days ago. EXAM: CT ANGIOGRAPHY CHEST WITH CONTRAST TECHNIQUE: Multidetector CT imaging of the chest was performed using the standard protocol during bolus administration of intravenous contrast. Multiplanar CT image reconstructions and MIPs were obtained to evaluate the vascular anatomy. RADIATION DOSE REDUCTION: This exam was performed according to the departmental dose-optimization program which includes automated exposure control, adjustment of the mA and/or kV according to patient size and/or use of iterative reconstruction technique. CONTRAST:  75 mL OMNIPAQUE IOHEXOL 350 MG/ML SOLN COMPARISON:  Single-view of the chest 07/27/2022. CT chest 03/23/2004. FINDINGS: Cardiovascular: Pulmonary emboli are identified, best  seen in branches to the lingula as on image 132 of series 5. There is no evidence of right heart strain. Calcific aortic and coronary atherosclerosis is present. The ascending thoracic aorta measures 4.3 cm in diameter. Heart size is normal. No pericardial effusion. Mediastinum/Nodes: No enlarged mediastinal, hilar, or axillary lymph nodes. Thyroid gland, trachea, and esophagus demonstrate no significant findings. Lungs/Pleura: No pleural effusion. Dependent airspace disease in the left lower lobe has an appearance most compatible with atelectasis. Upper Abdomen: A heterogeneous mass lesion in the right hepatic lobe measuring 4.0 cm is identified and there is a second similar lesion in the left hepatic lobe measuring 4.7 cm. These are present on the 2005 abdomen and pelvis CT where they measured 5 4 and 5.8 cm respectively. Visualized upper abdomen is otherwise unremarkable. Musculoskeletal: No acute or focal abnormality. Mid and lower thoracic spondylosis noted. Severe bilateral glenohumeral osteoarthritis is present. Review of the MIP images confirms the above findings. IMPRESSION: 1. The exam is positive for pulmonary emboli. No evidence of right heart strain. 2. Aortic atherosclerosis. 3. A heterogeneous mass lesion in the right hepatic lobe measuring 4.0 cm is identified and there is a second similar lesion left hepatic lobe measuring 4.7 cm. These are present on the 2005 CT scan and measures slightly smaller in size consistent with benign entities. 4. Calcific coronary artery disease. These results were called by telephone at the time of interpretation on 07/27/2022 at 10:16 am to provider Kaylina Cahue , who verbally acknowledged these results. Aortic Atherosclerosis (ICD10-I70.0). Electronically Signed: By: Inge Rise M.D. On: 07/27/2022 10:17   DG Chest Port 1 View  Result Date: 07/27/2022 CLINICAL DATA:  Chest pain EXAM: PORTABLE CHEST 1 VIEW COMPARISON:  Radiograph 03/16/2022, radiograph 10/07/2013, CT  03/23/2004 FINDINGS: Unchanged cardiomediastinal silhouette. There is unchanged left-sided pleural thickening and elevated left hemidiaphragm, as seen on multiple prior exams. There is no focal airspace consolidation. No large effusion or evidence of pneumothorax. Thoracic spondylosis. Bilateral shoulder degenerative changes, severe on the right with multiple ossified joint bodies noted. IMPRESSION: Unchanged left-sided pleural thickening and elevated left hemidiaphragm, as seen on multiple prior exams. No evidence of acute cardiopulmonary disease. Electronically Signed   By: Maurine Simmering M.D.   On: 07/27/2022 08:31    Procedures Procedures    Medications Ordered in ED Medications  morphine (PF) 4 MG/ML injection 4 mg (4 mg Intravenous Given 07/27/22 0825)  ondansetron (ZOFRAN) injection 4 mg (4 mg Intravenous Given 07/27/22 0823)  sodium chloride 0.9 % bolus 1,000 mL ( Intravenous Stopped 07/27/22 1012)  alum & mag hydroxide-simeth (MAALOX/MYLANTA) 200-200-20 MG/5ML suspension 30 mL (30 mLs Oral Given 07/27/22 0904)  iohexol (OMNIPAQUE) 350 MG/ML injection 100 mL (75  mLs Intravenous Contrast Given 07/27/22 0914)  Rivaroxaban (XARELTO) tablet 15 mg (15 mg Oral Given 07/27/22 1058)  morphine (PF) 4 MG/ML injection 4 mg (4 mg Intravenous Given 07/27/22 1107)  ondansetron (ZOFRAN) injection 4 mg (4 mg Intravenous Given 07/27/22 1107)    ED Course/ Medical Decision Making/ A&P                             Medical Decision Making Amount and/or Complexity of Data Reviewed Labs: ordered. Radiology: ordered.  Risk OTC drugs. Prescription drug management.   67 yo M with a chief complaints of neck pain and difficulty breathing.  He just had a surgical procedure done about a week ago by Dr. Rolena Infante, was listed as a anterior cervical decompression and discectomy fusion of C3-C7.  He has no obvious infectious symptoms.  I suspect most likely he is having recurrent spinal symptoms making him feel like he is having  difficulty breathing.  He is able to have good aeration on my left lung exam.  Will obtain a laboratory evaluation chest x-ray CT scan of the chest, CT of the C-spine.   I received a call from the radiologist to discuss the CT scan of the chest, has bilateral PEs with no significant clot burden with no right heart strain.  His troponin and BNP here are negative.  I discussed results with the patient who would like to go home on anticoagulation.  I discussed the case with his spinal surgeon, Dr. Rolena Infante.  He independently reviewed the patient's CT images.  He felt it was reasonable to reload him back on Xarelto.  I have verified the dosing with the ED pharmacist to did state to start him on 15mg  twice daily.  Patient was observed here for couple hours.  Continues to feel well.  Like to go home.  12:55 PM:  I have discussed the diagnosis/risks/treatment options with the patient and family.  Evaluation and diagnostic testing in the emergency department does not suggest an emergent condition requiring admission or immediate intervention beyond what has been performed at this time.  They will follow up with Ortho. We also discussed returning to the ED immediately if new or worsening sx occur. We discussed the sx which are most concerning (e.g., sudden worsening pain, fever, inability to tolerate by mouth, numbness weakness to the arms or legs difficulty breathing syncope, hemoptysis, worsening neck pain) that necessitate immediate return. Medications administered to the patient during their visit and any new prescriptions provided to the patient are listed below.  Medications given during this visit Medications  morphine (PF) 4 MG/ML injection 4 mg (4 mg Intravenous Given 07/27/22 0825)  ondansetron (ZOFRAN) injection 4 mg (4 mg Intravenous Given 07/27/22 0823)  sodium chloride 0.9 % bolus 1,000 mL ( Intravenous Stopped 07/27/22 1012)  alum & mag hydroxide-simeth (MAALOX/MYLANTA) 200-200-20 MG/5ML suspension 30 mL  (30 mLs Oral Given 07/27/22 0904)  iohexol (OMNIPAQUE) 350 MG/ML injection 100 mL (75 mLs Intravenous Contrast Given 07/27/22 0914)  Rivaroxaban (XARELTO) tablet 15 mg (15 mg Oral Given 07/27/22 1058)  morphine (PF) 4 MG/ML injection 4 mg (4 mg Intravenous Given 07/27/22 1107)  ondansetron (ZOFRAN) injection 4 mg (4 mg Intravenous Given 07/27/22 1107)     The patient appears reasonably screen and/or stabilized for discharge and I doubt any other medical condition or other [facility] requiring further screening, evaluation, or treatment in the ED at this time prior to discharge.  Final Clinical Impression(s) / ED Diagnoses Final diagnoses:  Single subsegmental pulmonary embolism without acute cor pulmonale    Rx / DC Orders ED Discharge Orders          Ordered    morphine (MSIR) 15 MG tablet  Every 4 hours PRN        07/27/22 1253    Rivaroxaban (XARELTO) 15 MG TABS tablet  2 times daily        07/27/22 [place], Breandan People, DO 07/27/22 1255

## 2022-07-27 NOTE — ED Notes (Signed)
ED Provider at bedside. 

## 2022-07-27 NOTE — ED Notes (Signed)
Dc instructions reviewed with patient. Patient voiced understanding. Dc with belongings.  °

## 2022-07-27 NOTE — ED Notes (Addendum)
Xray at [facility]. Pt alert, NAD, calm, interactive, resting with [place] C-collar in place. Family present.

## 2022-07-31 ENCOUNTER — Emergency Department (HOSPITAL_COMMUNITY): Payer: Medicare Other

## 2022-07-31 ENCOUNTER — Other Ambulatory Visit (HOSPITAL_COMMUNITY): Payer: Self-pay | Admitting: Orthopedic Surgery

## 2022-07-31 ENCOUNTER — Other Ambulatory Visit: Payer: Self-pay

## 2022-07-31 ENCOUNTER — Inpatient Hospital Stay (HOSPITAL_COMMUNITY)
Admission: EM | Admit: 2022-07-31 | Discharge: 2022-08-03 | DRG: 092 | Disposition: A | Discharge status: Home / Self Care | Payer: Medicare Other | Attending: Orthopedic Surgery | Admitting: Orthopedic Surgery

## 2022-07-31 DIAGNOSIS — Z85818 Personal history of malignant neoplasm of other sites of lip, oral cavity, and pharynx: Secondary | ICD-10-CM

## 2022-07-31 DIAGNOSIS — I739 Peripheral vascular disease, unspecified: Secondary | ICD-10-CM | POA: Diagnosis present

## 2022-07-31 DIAGNOSIS — Z86711 Personal history of pulmonary embolism: Secondary | ICD-10-CM

## 2022-07-31 DIAGNOSIS — Z87442 Personal history of urinary calculi: Secondary | ICD-10-CM

## 2022-07-31 DIAGNOSIS — M1990 Unspecified osteoarthritis, unspecified site: Secondary | ICD-10-CM | POA: Diagnosis present

## 2022-07-31 DIAGNOSIS — Y838 Other surgical procedures as the cause of abnormal reaction of the patient, or of later complication, without mention of misadventure at the time of the procedure: Secondary | ICD-10-CM | POA: Diagnosis present

## 2022-07-31 DIAGNOSIS — Z880 Allergy status to penicillin: Secondary | ICD-10-CM | POA: Diagnosis not present

## 2022-07-31 DIAGNOSIS — I472 Ventricular tachycardia, unspecified: Secondary | ICD-10-CM | POA: Diagnosis present

## 2022-07-31 DIAGNOSIS — R1310 Dysphagia, unspecified: Secondary | ICD-10-CM | POA: Diagnosis present

## 2022-07-31 DIAGNOSIS — Z7901 Long term (current) use of anticoagulants: Secondary | ICD-10-CM | POA: Diagnosis not present

## 2022-07-31 DIAGNOSIS — Z981 Arthrodesis status: Secondary | ICD-10-CM | POA: Diagnosis not present

## 2022-07-31 DIAGNOSIS — M25512 Pain in left shoulder: Secondary | ICD-10-CM | POA: Diagnosis present

## 2022-07-31 DIAGNOSIS — F32A Depression, unspecified: Secondary | ICD-10-CM | POA: Diagnosis present

## 2022-07-31 DIAGNOSIS — G9782 Other postprocedural complications and disorders of nervous system: Principal | ICD-10-CM | POA: Diagnosis present

## 2022-07-31 DIAGNOSIS — Z79899 Other long term (current) drug therapy: Secondary | ICD-10-CM | POA: Diagnosis not present

## 2022-07-31 LAB — CBC WITH DIFFERENTIAL/PLATELET
Abs Immature Granulocytes: 0.04 10*3/uL (ref 0.00–0.07)
Basophils Absolute: 0 10*3/uL (ref 0.0–0.1)
Basophils Relative: 1 %
Eosinophils Absolute: 0.1 10*3/uL (ref 0.0–0.5)
Eosinophils Relative: 1 %
HCT: 44.1 % (ref 39.0–52.0)
Hemoglobin: 14.4 g/dL (ref 13.0–17.0)
Immature Granulocytes: 1 %
Lymphocytes Relative: 12 %
Lymphs Abs: 1 10*3/uL (ref 0.7–4.0)
MCH: 32.5 pg (ref 26.0–34.0)
MCHC: 32.7 g/dL (ref 30.0–36.0)
MCV: 99.5 fL (ref 80.0–100.0)
Monocytes Absolute: 0.9 10*3/uL (ref 0.1–1.0)
Monocytes Relative: 11 %
Neutro Abs: 6.4 10*3/uL (ref 1.7–7.7)
Neutrophils Relative %: 74 %
Platelets: 310 10*3/uL (ref 150–400)
RBC: 4.43 MIL/uL (ref 4.22–5.81)
RDW: 12.9 % (ref 11.5–15.5)
WBC: 8.5 10*3/uL (ref 4.0–10.5)
nRBC: 0 % (ref 0.0–0.2)

## 2022-07-31 LAB — I-STAT CHEM 8, ED
BUN: 24 mg/dL — ABNORMAL HIGH (ref 8–23)
Calcium, Ion: 1.05 mmol/L — ABNORMAL LOW (ref 1.15–1.40)
Chloride: 104 mmol/L (ref 98–111)
Creatinine, Ser: 0.5 mg/dL — ABNORMAL LOW (ref 0.61–1.24)
Glucose, Bld: 67 mg/dL — ABNORMAL LOW (ref 70–99)
HCT: 37 % — ABNORMAL LOW (ref 39.0–52.0)
Hemoglobin: 12.6 g/dL — ABNORMAL LOW (ref 13.0–17.0)
Potassium: 3.5 mmol/L (ref 3.5–5.1)
Sodium: 139 mmol/L (ref 135–145)
TCO2: 26 mmol/L (ref 22–32)

## 2022-07-31 LAB — BASIC METABOLIC PANEL
Anion gap: 11 (ref 5–15)
BUN: 23 mg/dL (ref 8–23)
CO2: 23 mmol/L (ref 22–32)
Calcium: 8.8 mg/dL — ABNORMAL LOW (ref 8.9–10.3)
Chloride: 98 mmol/L (ref 98–111)
Creatinine, Ser: 0.89 mg/dL (ref 0.61–1.24)
GFR, Estimated: >60 mL/min (ref >60)
Glucose, Bld: 80 mg/dL (ref 70–99)
Potassium: 4.3 mmol/L (ref 3.5–5.1)
Sodium: 132 mmol/L — ABNORMAL LOW (ref 135–145)

## 2022-07-31 LAB — MAGNESIUM: Magnesium: 1.9 mg/dL (ref 1.7–2.4)

## 2022-07-31 LAB — PROTIME-INR
INR: 2.2 — ABNORMAL HIGH (ref 0.8–1.2)
Prothrombin Time: 24 seconds — ABNORMAL HIGH (ref 11.4–15.2)

## 2022-07-31 MED ORDER — ACETAMINOPHEN 650 MG RE SUPP: 650.0000 mg | RECTAL | Status: DC | PRN

## 2022-07-31 MED ORDER — DEXAMETHASONE SODIUM PHOSPHATE 4 MG/ML IJ SOLN
4.0000 mg | Freq: Four times a day (QID) | INTRAMUSCULAR | Status: DC
  Administered 2022-07-31 – 2022-08-02 (×7): 4 mg via INTRAVENOUS
  Filled 2022-07-31 (×8): qty 1

## 2022-07-31 MED ORDER — ORAL CARE MOUTH RINSE: 15.0000 mL | OROMUCOSAL | Status: DC | PRN

## 2022-07-31 MED ORDER — ATORVASTATIN CALCIUM 40 MG PO TABS
40.0000 mg | ORAL_TABLET | Freq: Every morning | ORAL | Status: DC
  Administered 2022-08-01 – 2022-08-03 (×3): 40 mg via ORAL
  Filled 2022-07-31 (×3): qty 1

## 2022-07-31 MED ORDER — HYDROMORPHONE HCL 1 MG/ML IJ SOLN
1.0000 mg | INTRAMUSCULAR | Status: DC | PRN
  Administered 2022-07-31 – 2022-08-01 (×4): 1 mg via INTRAVENOUS
  Filled 2022-07-31 (×5): qty 1

## 2022-07-31 MED ORDER — POTASSIUM CHLORIDE CRYS ER 20 MEQ PO TBCR: 40.0000 meq | EXTENDED_RELEASE_TABLET | Freq: Once | ORAL | Status: DC

## 2022-07-31 MED ORDER — MORPHINE SULFATE (PF) 4 MG/ML IV SOLN
4.0000 mg | Freq: Once | INTRAVENOUS | Status: AC
  Administered 2022-07-31: 4 mg via INTRAVENOUS
  Filled 2022-07-31: qty 1

## 2022-07-31 MED ORDER — ORAL CARE MOUTH RINSE
15.0000 mL | OROMUCOSAL | Status: DC
  Administered 2022-08-01 – 2022-08-03 (×6): 15 mL via OROMUCOSAL

## 2022-07-31 MED ORDER — FLEET ENEMA 7-19 GM/118ML RE ENEM: 1.0000 | ENEMA | Freq: Once | RECTAL | Status: DC | PRN

## 2022-07-31 MED ORDER — POTASSIUM CHLORIDE 10 MEQ/100ML IV SOLN: 10.0000 meq | Freq: Once | INTRAVENOUS | Status: DC

## 2022-07-31 MED ORDER — MENTHOL 3 MG MT LOZG: 1.0000 | LOZENGE | OROMUCOSAL | Status: DC | PRN

## 2022-07-31 MED ORDER — LACTATED RINGERS IV SOLN: INTRAVENOUS | Status: DC

## 2022-07-31 MED ORDER — ENOXAPARIN SODIUM 40 MG/0.4ML IJ SOSY: 40.0000 mg | PREFILLED_SYRINGE | INTRAMUSCULAR | Status: DC

## 2022-07-31 MED ORDER — ONDANSETRON HCL 4 MG/2ML IJ SOLN
4.0000 mg | Freq: Four times a day (QID) | INTRAMUSCULAR | Status: DC | PRN
  Administered 2022-07-31 – 2022-08-01 (×2): 4 mg via INTRAVENOUS
  Filled 2022-07-31 (×2): qty 2

## 2022-07-31 MED ORDER — ONDANSETRON HCL 4 MG/2ML IJ SOLN
4.0000 mg | Freq: Once | INTRAMUSCULAR | Status: AC
  Administered 2022-07-31: 4 mg via INTRAVENOUS
  Filled 2022-07-31: qty 2

## 2022-07-31 MED ORDER — METHOCARBAMOL 1000 MG/10ML IJ SOLN: 500.0000 mg | Freq: Four times a day (QID) | INTRAVENOUS | Status: DC | PRN

## 2022-07-31 MED ORDER — MORPHINE SULFATE (PF) 2 MG/ML IV SOLN: 2.0000 mg | INTRAVENOUS | Status: DC | PRN

## 2022-07-31 MED ORDER — SODIUM CHLORIDE 0.9% FLUSH
3.0000 mL | Freq: Two times a day (BID) | INTRAVENOUS | Status: DC
  Administered 2022-07-31 – 2022-08-02 (×5): 3 mL via INTRAVENOUS

## 2022-07-31 MED ORDER — ONDANSETRON HCL 4 MG PO TABS: 4.0000 mg | ORAL_TABLET | Freq: Four times a day (QID) | ORAL | Status: DC | PRN

## 2022-07-31 MED ORDER — ACETAMINOPHEN 325 MG PO TABS
650.0000 mg | ORAL_TABLET | ORAL | Status: DC | PRN
  Administered 2022-08-01: 650 mg via ORAL
  Filled 2022-07-31: qty 2

## 2022-07-31 MED ORDER — SODIUM CHLORIDE 0.9% FLUSH: 3.0000 mL | INTRAVENOUS | Status: DC | PRN

## 2022-07-31 MED ORDER — IOHEXOL 350 MG/ML SOLN
75.0000 mL | Freq: Once | INTRAVENOUS | Status: AC | PRN
  Administered 2022-07-31: 75 mL via INTRAVENOUS

## 2022-07-31 MED ORDER — ENOXAPARIN SODIUM 100 MG/ML IJ SOSY
1.0000 mg/kg | PREFILLED_SYRINGE | Freq: Two times a day (BID) | INTRAMUSCULAR | Status: DC
  Administered 2022-07-31 – 2022-08-01 (×4): 85 mg via SUBCUTANEOUS
  Filled 2022-07-31: qty 0.85
  Filled 2022-07-31 (×2): qty 1
  Filled 2022-07-31: qty 0.85

## 2022-07-31 MED ORDER — SODIUM CHLORIDE 0.9 % IV SOLN: 250.0000 mL | INTRAVENOUS | Status: DC

## 2022-07-31 MED ORDER — SODIUM CHLORIDE 0.9 % IV BOLUS
1000.0000 mL | Freq: Once | INTRAVENOUS | Status: AC
  Administered 2022-07-31: 1000 mL via INTRAVENOUS

## 2022-07-31 MED ORDER — DEXAMETHASONE 4 MG PO TABS
4.0000 mg | ORAL_TABLET | Freq: Four times a day (QID) | ORAL | Status: DC
  Filled 2022-07-31 (×7): qty 1

## 2022-07-31 MED ORDER — PHENOL 1.4 % MT LIQD: 1.0000 | OROMUCOSAL | Status: DC | PRN

## 2022-07-31 MED ORDER — DULOXETINE HCL 60 MG PO CPEP
60.0000 mg | ORAL_CAPSULE | Freq: Every morning | ORAL | Status: DC
  Administered 2022-08-01 – 2022-08-03 (×3): 60 mg via ORAL
  Filled 2022-07-31 (×3): qty 1

## 2022-07-31 MED ORDER — METHOCARBAMOL 500 MG PO TABS
500.0000 mg | ORAL_TABLET | Freq: Four times a day (QID) | ORAL | Status: DC | PRN
  Administered 2022-08-01 – 2022-08-02 (×3): 500 mg via ORAL
  Filled 2022-07-31 (×3): qty 1

## 2022-07-31 NOTE — H&P (Signed)
Chief Complaint: Progressive dysphagia status post ACDF C3-7  History: Spencer Cunningham is a very pleasant 67 year old gentleman who had a 4 level ACDF cervical spondylitic radiculopathy approximately 2 weeks ago.  Patient initially did well and he was discharged home.  Subsequently he was started to have some dysphagia as well as shortness of breath and so he was seen at the outpatient emergency department at [facility].  CT scan imaging at that time demonstrated a PE.  The patient has had a history of PEs and was on anticoagulation.  Patient was continued on his Xarelto and was discharged home.  Patient states that Saturday when he got home he was having progressive dysphagia and he was unable to eat and swallow solids.  As a result of his progressive dysphagia he presents to the emergency room today for further evaluation.  Past Medical History:  Diagnosis Date   Arthritis    OA- "everywhere"    Cancer    tonsil   Chronic kidney disease    passed renal calculi  h/o , passed spontaneously   Depression    Peripheral vascular disease    Pulmonary embolism 06/22/2012   all care has been in [facility], treated first /w Heparin , then Coumadin & now Xarelto      Allergies  Allergen Reactions   Penicillins Other (See Comments)    Childhood allergy., rash    No current facility-administered medications on file prior to encounter.   Current Outpatient Medications on File Prior to Encounter  Medication Sig Dispense Refill   albuterol (VENTOLIN HFA) 108 (90 Base) MCG/ACT inhaler Inhale 1-2 puffs into the lungs every 6 (six) hours as needed for wheezing or shortness of breath. (Patient not taking: Reported on 07/11/2022) 1 each 0   atorvastatin (LIPITOR) 40 MG tablet Take 40 mg by mouth in the morning.     DULoxetine (CYMBALTA) 60 MG capsule Take 60 mg by mouth in the morning.     morphine (MSIR) 15 MG tablet Take 0.5 tablets (7.5 mg total) by mouth every 4 (four) hours as needed for severe pain. 7  tablet 0   ondansetron (ZOFRAN) 4 MG tablet Take 1 tablet (4 mg total) by mouth every 8 (eight) hours as needed for nausea or vomiting. 20 tablet 0   Rivaroxaban (XARELTO) 15 MG TABS tablet Take 1 tablet (15 mg total) by mouth 2 (two) times daily for 21 days. 42 tablet 0   rivaroxaban (XARELTO) 20 MG TABS tablet Take 20 mg by mouth every evening.     traZODone (DESYREL) 100 MG tablet Take 100 mg by mouth at bedtime as needed for sleep.      Physical Exam: Vitals:   07/31/22 1045 07/31/22 1130  BP: 125/83 138/84  Pulse: 73 75  Resp: 15 16  Temp:    SpO2: 92% 91%   There is no height or weight on file to calculate BMI. He is alert and oriented x 3. No labored breathing.  No chest pain. Neurological exam: 5/5 motor strength in the lower extremity bilaterally.  Negative Babinski test, no clonus, generalized weakness in the left upper extremity with 4+/5 deltoid and bicep strength.  Positive numbness and dysesthesias in the left upper extremity.  5/5 motor strength in the right upper extremity.  Negative Hoffman test. She is ambulatory with no altered gait.  Negative Romberg's test. Incision site: Is tender to direct palpation.  There is some slight swelling in the cervical spine along the anterior left side but  no drainage.  The wound itself is clean dry and intact.  No signs of infection.  Image: CT Soft Tissue Neck W Contrast  Result Date: 07/31/2022 CLINICAL DATA:  Dysphagia EXAM: CT NECK WITH CONTRAST TECHNIQUE: Multidetector CT imaging of the neck was performed using the standard protocol following the bolus administration of intravenous contrast. RADIATION DOSE REDUCTION: This exam was performed according to the departmental dose-optimization program which includes automated exposure control, adjustment of the mA and/or kV according to patient size and/or use of iterative reconstruction technique. CONTRAST:  75mL OMNIPAQUE IOHEXOL 350 MG/ML SOLN COMPARISON:  Cervical spine CT 07/27/2022  FINDINGS: Pharynx and larynx: The nasal cavity and nasopharynx are unremarkable. There is mucosal edema along the lower aspect of the posterior oropharyngeal/hypopharyngeal wall (3-81). The oral cavity and oropharynx are otherwise unremarkable. The parapharyngeal spaces are clear. The vocal folds are normal in appearance.  The epiglottis is normal. The patient is status post recent C3 through C7 ACDF. There is retropharyngeal fluid measuring up to 9 mm in thickness at the C2 level. This is contiguous with a fluid collection in the left neck along the left aspect of the laryngeal cartilage. There is peripheral enhancement around this collection as well as small locules of gas. The collection measures up to approximately 3.9 cm AP x 1.9 cm TV x 6.1 cm CC at the level of the cricoid cartilage. The retropharyngeal portion of the fluid collection extends from the C2 through C7 levels measuring up to approximately 8.0 cm. The collection extends posterior to the esophagus without mass effect. The collection is overall grossly similar in size to the prior CT cervical spine but with slightly decreased gas. Salivary glands: The parotid and submandibular glands are unremarkable. Thyroid: Unremarkable. Lymph nodes: There is no pathologic lymphadenopathy in the neck. Vascular: There is calcified plaque of the carotid bifurcations bilaterally with high-grade stenosis of the proximal right internal carotid artery. There is also probable hemodynamically significant stenosis on the left. Limited intracranial: The imaged intracranial compartment is unremarkable. Visualized orbits: The globes and orbits are unremarkable. Mastoids and visualized paranasal sinuses: Clear. Skeleton: Postsurgical changes reflecting recent C3 through C7 ACDF are noted. There is no evidence of hardware related complication. There are calcified loose bodies anterior to the right scapula. Upper chest: The imaged lung apices are clear. Other: None. IMPRESSION:  1. Postsurgical changes reflecting recent C3 through C7 ACDF with a peripherally enhancing collection in the left neck extending to the retropharynx with small internal locules of gas and mucosal edema along the adjacent posterior oropharynx/hypopharynx. The collection is grossly similar in size compared to the recent CT cervical spine and again may reflect a postoperative seroma; however, given peripheral enhancement, abscess is not excluded. 2. Calcified plaque of the carotid bifurcations bilaterally with high-grade stenosis on the right and probable hemodynamically significant stenosis on the left. Consider nonemergent CTA or carotid doppler for better evaluation. Electronically Signed   By: Lesia HausenPeter  Noone M.D.   On: 07/31/2022 11:15   DG Chest 2 View  Result Date: 07/31/2022 CLINICAL DATA:  Cough and shortness of breath. EXAM: CHEST - 2 VIEW COMPARISON:  07/27/2022 FINDINGS: Heart size and mediastinal contours are unremarkable. Asymmetric elevation of the left hemidiaphragm, unchanged. Similar appearance of left-sided pleural thickening. No airspace disease, interstitial edema or pneumothorax. IMPRESSION: 1. No active cardiopulmonary abnormalities. 2. Stable left-sided pleural thickening. Electronically Signed   By: Signa Kellaylor  Stroud M.D.   On: 07/31/2022 09:03   CT CERVICAL SPINE WO CONTRAST  Result Date:  07/27/2022 CLINICAL DATA:  Spine surgery/procedure, postop, infection suspected. Surgery 1 week ago EXAM: CT CERVICAL SPINE WITHOUT CONTRAST TECHNIQUE: Multidetector CT imaging of the cervical spine was performed without intravenous contrast. Multiplanar CT image reconstructions were also generated. RADIATION DOSE REDUCTION: This exam was performed according to the departmental dose-optimization program which includes automated exposure control, adjustment of the mA and/or kV according to patient size and/or use of iterative reconstruction technique. COMPARISON:  None Available. FINDINGS: Alignment: Facet  joints are aligned without dislocation or traumatic listhesis. Dens and lateral masses are aligned. Skull base and vertebrae: Postsurgical changes from recent C3-C7 ACDF with intact hardware. Hardware is well seated. No perihardware lucency or fracture. Remaining osseous structures within normal limits without fracture or pathologic bone process. Soft tissues and spinal canal: Fluid and air collection in the prevertebral soft tissues tracking the left of the trachea and thyroid gland along the surgical incision site. Collection measures approximately 3.6 x 1.8 cm transaxially and extends approximately 6.5 cm in craniocaudal length from the C3 through T1 levels. No visible canal hematoma, although canal is largely obscured by artifact from hardware. Disc levels: Mild disc height loss at C7-T1. Degenerative facet arthropathy of multiple levels, most pronounced at C7-T1. Upper chest: Negative. Other: Bilateral carotid atherosclerosis. IMPRESSION: 1. Postsurgical changes from recent C3-C7 ACDF with intact hardware. 2. Fluid and air collection in the prevertebral soft tissues and left neck along the surgical incision site. Collection measures approximately 3.6 x 1.8 cm transaxially and extends approximately 6.5 cm in craniocaudal length from the C3 through T1 levels. Findings may represent postoperative seroma or hematoma. An infected fluid collection would be difficult to exclude by imaging alone. Electronically Signed   By: Duanne Guess D.O.   On: 07/27/2022 10:45   CT Angio Chest PE W and/or Wo Contrast  Addendum Date: 07/27/2022   ADDENDUM REPORT: 07/27/2022 10:28 ADDENDUM: This addendum is given for the purpose of noting the patient ascending thoracic aorta measures 4.3 cm. Recommend annual imaging followup by CTA or MRA. This recommendation follows 2010 ACCF/AHA/AATS/ACR/ASA/SCA/SCAI/SIR/STS/SVM Guidelines for the Diagnosis and Management of Patients with Thoracic Aortic Disease. Circulation. 2010; 121:  [id number]. Aortic aneurysm NOS (ICD10-I71.9) Electronically Signed   By: Drusilla Kanner M.D.   On: 07/27/2022 10:28   Result Date: 07/27/2022 CLINICAL DATA:  Patient status post cervical spine surgery 07/20/2022. Onset difficulty breathing with neck, chest and shoulder pain 2 days ago. EXAM: CT ANGIOGRAPHY CHEST WITH CONTRAST TECHNIQUE: Multidetector CT imaging of the chest was performed using the standard protocol during bolus administration of intravenous contrast. Multiplanar CT image reconstructions and MIPs were obtained to evaluate the vascular anatomy. RADIATION DOSE REDUCTION: This exam was performed according to the departmental dose-optimization program which includes automated exposure control, adjustment of the mA and/or kV according to patient size and/or use of iterative reconstruction technique. CONTRAST:  75 mL OMNIPAQUE IOHEXOL 350 MG/ML SOLN COMPARISON:  Single-view of the chest 07/27/2022. CT chest 03/23/2004. FINDINGS: Cardiovascular: Pulmonary emboli are identified, best seen in branches to the lingula as on image 132 of series 5. There is no evidence of right heart strain. Calcific aortic and coronary atherosclerosis is present. The ascending thoracic aorta measures 4.3 cm in diameter. Heart size is normal. No pericardial effusion. Mediastinum/Nodes: No enlarged mediastinal, hilar, or axillary lymph nodes. Thyroid gland, trachea, and esophagus demonstrate no significant findings. Lungs/Pleura: No pleural effusion. Dependent airspace disease in the left lower lobe has an appearance most compatible with atelectasis. Upper Abdomen: A heterogeneous mass lesion in  the right hepatic lobe measuring 4.0 cm is identified and there is a second similar lesion in the left hepatic lobe measuring 4.7 cm. These are present on the 2005 abdomen and pelvis CT where they measured 5 4 and 5.8 cm respectively. Visualized upper abdomen is otherwise unremarkable. Musculoskeletal: No acute or focal abnormality. Mid  and lower thoracic spondylosis noted. Severe bilateral glenohumeral osteoarthritis is present. Review of the MIP images confirms the above findings. IMPRESSION: 1. The exam is positive for pulmonary emboli. No evidence of right heart strain. 2. Aortic atherosclerosis. 3. A heterogeneous mass lesion in the right hepatic lobe measuring 4.0 cm is identified and there is a second similar lesion left hepatic lobe measuring 4.7 cm. These are present on the 2005 CT scan and measures slightly smaller in size consistent with benign entities. 4. Calcific coronary artery disease. These results were called by telephone at the time of interpretation on 07/27/2022 at 10:16 am to provider DAN FLOYD , who verbally acknowledged these results. Aortic Atherosclerosis (ICD10-I70.0). Electronically Signed: By: Drusilla Kanner M.D. On: 07/27/2022 10:17   DG Chest Port 1 View  Result Date: 07/27/2022 CLINICAL DATA:  Chest pain EXAM: PORTABLE CHEST 1 VIEW COMPARISON:  Radiograph 03/16/2022, radiograph 10/07/2013, CT 03/23/2004 FINDINGS: Unchanged cardiomediastinal silhouette. There is unchanged left-sided pleural thickening and elevated left hemidiaphragm, as seen on multiple prior exams. There is no focal airspace consolidation. No large effusion or evidence of pneumothorax. Thoracic spondylosis. Bilateral shoulder degenerative changes, severe on the right with multiple ossified joint bodies noted. IMPRESSION: Unchanged left-sided pleural thickening and elevated left hemidiaphragm, as seen on multiple prior exams. No evidence of acute cardiopulmonary disease. Electronically Signed   By: Caprice Renshaw M.D.   On: 07/27/2022 08:31   DG Cervical Spine 2 or 3 views  Result Date: 07/20/2022 CLINICAL DATA:  ACDF C3-C7 EXAM: CERVICAL SPINE - 2-3 VIEW COMPARISON:  None Available. FINDINGS: Four fluoroscopic images are obtained during the performance of the procedure and are provided for interpretation only. Images demonstrate ACDF of C3-C7,  with interbody disc spacers at each level. Fluoroscopy time: 96.5 seconds Cumulative air kerma: 14.13 mGy IMPRESSION: Four fluoroscopic images are obtained during the performance of the procedure and are provided for interpretation only. Electronically Signed   By: Wiliam Ke M.D.   On: 07/20/2022 17:15   DG C-Arm 1-60 Min-No Report  Result Date: 07/20/2022 Fluoroscopy was utilized by the requesting physician.  No radiographic interpretation.   DG C-Arm 1-60 Min-No Report  Result Date: 07/20/2022 Fluoroscopy was utilized by the requesting physician.  No radiographic interpretation.   DG C-Arm 1-60 Min-No Report  Result Date: 07/20/2022 Fluoroscopy was utilized by the requesting physician.  No radiographic interpretation.   DG C-Arm 1-60 Min-No Report  Result Date: 07/20/2022 Fluoroscopy was utilized by the requesting physician.  No radiographic interpretation.   DG C-Arm 1-60 Min-No Report  Result Date: 07/20/2022 Fluoroscopy was utilized by the requesting physician.  No radiographic interpretation.   DG C-Arm 1-60 Min-No Report  Result Date: 07/20/2022 Fluoroscopy was utilized by the requesting physician.  No radiographic interpretation.    A/P: Rayen is a very pleasant 67 year old gentleman who is now just 2 weeks out from a 4 level ACDF with progressive dysphagia as well as a PE.  He presents to the emergency room because of inability to take p.o. diet.  1.  Patient states that he attempted to take his Xarelto today but was unable to swallow it.  At this point I will  plan on starting him on Lovenox as an anticoagulation alternative until his dysphagia resolves and he can resume his Xarelto. 2.  Patient had 4 beats of V. tach according to the emergency physician.  Cardiology was consulted and indicated there was nothing further that was needed. 3.  Patient will be admitted for further observation and I will have the speech and swallow consult to determine next best course of action.   This may include a feeding tube in order to improve his nutrition while the swelling decreases. 4.  Will put him on  24-hour IV Decadron to help reduce the swelling and improve his quality of life. 5.  Will continue with IV pain medication as he has difficulty taking p.o.

## 2022-07-31 NOTE — ED Notes (Signed)
Pt transported to XRay 

## 2022-07-31 NOTE — ED Notes (Signed)
Placed 2L [place] due to sats dropping after pain meds.

## 2022-07-31 NOTE — ED Triage Notes (Signed)
Patient here with complaint of dysphagia since Friday last week. Recent anterior discectomy, was seen at [facility] several days and diagnosed with PE and home xarelto dose increased. Since Friday patient has been unable to swallow and unable to take his medications. Patient is alert and in no apparent distress at this time.

## 2022-07-31 NOTE — ED Notes (Signed)
[facility] at bedside

## 2022-07-31 NOTE — Progress Notes (Signed)
ANTICOAGULATION CONSULT NOTE - Initial Consult  Pharmacy Consult for Lovenox Indication: pulmonary embolus  Allergies  Allergen Reactions   Penicillins Other (See Comments)    Childhood allergy., rash    Patient Measurements: Height: 5\' 9"  (175.3 cm) Weight: 86.2 kg (190 lb) IBW/kg (Calculated) : 70.7  Vital Signs: Temp: 99.2 F (37.3 C) (04/08 1650) Temp Source: Oral (04/08 1650) BP: 134/87 (04/08 1730) Pulse Rate: 79 (04/08 1730)  Labs: Recent Labs    07/31/22 0800 07/31/22 0923  HGB 14.4 12.6*  HCT 44.1 37.0*  PLT 310  --   LABPROT 24.0*  --   INR 2.2*  --   CREATININE 0.89 0.50*    Estimated Creatinine Clearance: 97.5 mL/min (A) (by C-G formula based on SCr of 0.5 mg/dL (L)).   Medical History: Past Medical History:  Diagnosis Date   Arthritis    OA- "everywhere"    Cancer    tonsil   Chronic kidney disease    passed renal calculi  h/o , passed spontaneously   Depression    Peripheral vascular disease    Pulmonary embolism 06/22/2012   all care has been in [facility], treated first /w Heparin , then Coumadin & now Xarelto      Medications:  (Not in a hospital admission)  Scheduled:   [START ON 08/01/2022] atorvastatin  40 mg Oral q AM   dexamethasone (DECADRON) injection  4 mg Intravenous Q6H   Or   dexamethasone  4 mg Oral Q6H   [START ON 08/01/2022] DULoxetine  60 mg Oral q AM   enoxaparin (LOVENOX) injection  1 mg/kg Subcutaneous Q12H   sodium chloride flush  3 mL Intravenous Q12H    Assessment: 67 YO male presenting with progressive dysphagia. He has a recent history of PE (2 weeks ago) and was discharged on Xarelto. Due to the patient's dysphagia, he has been unable to take his Xarelto today. His last reported dose of Xarelto was 4/7 at 1800. Pharmacy has been consulted for Lovenox dosing.   Hgb 14.4, Plts 44.1--stable. No signs/symptoms of bleeding reported.   Goals: Monitor platelets by anticoagulation protocol: Yes   Plan:  Start  Lovenox 1mg /kg q12hrs  Monitor CBC, s/sx of bleeding daily    Cherylin Mylar, PharmD PGY1 Pharmacy Resident 4/8/20246:12 PM

## 2022-07-31 NOTE — ED Notes (Signed)
ED TO INPATIENT HANDOFF REPORT  ED Nurse Name and Phone #: [phone]   S Name/Age/Gender Spencer Cunningham 67 y.o. male Room/Bed: 039C/039C  Code Status   Code Status: Full Code  Home/SNF/Other Home Patient oriented to: self, place, time, and situation Is this baseline? Yes   Triage Complete: Triage complete  Chief Complaint Dysphagia [R13.10]  Triage Note Patient here with complaint of dysphagia since Friday last week. Recent anterior discectomy, was seen at [facility] several days and diagnosed with PE and home xarelto dose increased. Since Friday patient has been unable to swallow and unable to take his medications. Patient is alert and in no apparent distress at this time.   Allergies Allergies  Allergen Reactions   Penicillins Other (See Comments)    Childhood allergy., rash    Level of Care/Admitting Diagnosis ED Disposition     ED Disposition  Admit   Condition  --   Comment  Hospital Area: [facility] [[id number]]  Level of Care: Med-Surg [16]  May admit patient to [facility] or [facility] if equivalent level of care is available:: Yes  Covid Evaluation: Confirmed COVID Negative  Diagnosis: Dysphagia [259563]  Admitting Physician: Venita Lick [[id number]]  Attending Physician: Venita Lick [[id number]]  Bed request comments: 3C  Certification:: I certify this patient will need inpatient services for at least 2 midnights  Estimated Length of Stay: 2          B Medical/Surgery History Past Medical History:  Diagnosis Date   Arthritis    OA- "everywhere"    Cancer    tonsil   Chronic kidney disease    passed renal calculi  h/o , passed spontaneously   Depression    Peripheral vascular disease    Pulmonary embolism 06/22/2012   all care has been in [facility], treated first /w Heparin , then Coumadin & now Xarelto     Past Surgical History:  Procedure Laterality Date   ANTERIOR CERVICAL DECOMPRESSION/DISCECTOMY FUSION 4 LEVELS N/A  07/20/2022   Procedure: ANTERIOR CERVICAL DECOMPRESSION/DISCECTOMY FUSION 4 LEVELS C3-C7;  Surgeon: Venita Lick, MD;  Location: [facility] OR;  Service: Orthopedics;  Laterality: N/A;  4.5 hrs 3 C-Bed   BACK SURGERY  2008   lumbar   CERVICAL LAMINECTOMY  2002   HIP RESECTION ARTHROPLASTY Right 2000   PORTACATH PLACEMENT Bilateral 2014   SHOULDER ARTHROSCOPY W/ ROTATOR CUFF REPAIR Right 2010   TOTAL HIP ARTHROPLASTY Left 10/17/2013   Procedure: LEFT TOTAL HIP ARTHROPLASTY ANTERIOR APPROACH;  Surgeon: Loanne Drilling, MD;  Location: [facility] OR;  Service: Orthopedics;  Laterality: Left;     A IV Location/Drains/Wounds Patient Lines/Drains/Airways Status     Active Line/Drains/Airways     Name Placement date Placement time Site Days   Peripheral IV 07/31/22 20 G Anterior;Distal;Right;Upper Arm 07/31/22  0800  Arm  less than 1            Intake/Output Last 24 hours No intake or output data in the 24 hours ending 07/31/22 1651  Labs/Imaging Results for orders placed or performed during the hospital encounter of 07/31/22 (from the past 48 hour(s))  Basic metabolic panel     Status: Abnormal   Collection Time: 07/31/22  8:00 AM  Result Value Ref Range   Sodium 132 (L) 135 - 145 mmol/L   Potassium 4.3 3.5 - 5.1 mmol/L   Chloride 98 98 - 111 mmol/L   CO2 23 22 - 32 mmol/L   Glucose, Bld 80 70 - 99  mg/dL    Comment: Glucose reference range applies only to samples taken after fasting for at least 8 hours.   BUN 23 8 - 23 mg/dL   Creatinine, Ser 1.610.89 0.61 - 1.24 mg/dL   Calcium 8.8 (L) 8.9 - 10.3 mg/dL   GFR, Estimated >09>60 >60>60 mL/min    Comment: (NOTE) Calculated using the CKD-EPI Creatinine Equation (2021)    Anion gap 11 5 - 15    Comment: Performed at [facility], [address] [place], [place] [address]  CBC with Differential     Status: None   Collection Time: 07/31/22  8:00 AM  Result Value Ref Range   WBC 8.5 4.0 - 10.5 K/uL   RBC 4.43 4.22 - 5.81 MIL/uL   Hemoglobin  14.4 13.0 - 17.0 g/dL   HCT 81.144.1 91.439.0 - 78.252.0 %   MCV 99.5 80.0 - 100.0 fL   MCH 32.5 26.0 - 34.0 pg   MCHC 32.7 30.0 - 36.0 g/dL   RDW 95.612.9 21.311.5 - 08.615.5 %   Platelets 310 150 - 400 K/uL   nRBC 0.0 0.0 - 0.2 %   Neutrophils Relative % 74 %   Neutro Abs 6.4 1.7 - 7.7 K/uL   Lymphocytes Relative 12 %   Lymphs Abs 1.0 0.7 - 4.0 K/uL   Monocytes Relative 11 %   Monocytes Absolute 0.9 0.1 - 1.0 K/uL   Eosinophils Relative 1 %   Eosinophils Absolute 0.1 0.0 - 0.5 K/uL   Basophils Relative 1 %   Basophils Absolute 0.0 0.0 - 0.1 K/uL   Immature Granulocytes 1 %   Abs Immature Granulocytes 0.04 0.00 - 0.07 K/uL    Comment: Performed at [facility], [address] [place], [place] [address]  Protime-INR     Status: Abnormal   Collection Time: 07/31/22  8:00 AM  Result Value Ref Range   Prothrombin Time 24.0 (H) 11.4 - 15.2 seconds   INR 2.2 (H) 0.8 - 1.2    Comment: (NOTE) INR goal varies based on device and disease states. Performed at [facility], [address] [place], [place] [address]   Magnesium     Status: None   Collection Time: 07/31/22  8:00 AM  Result Value Ref Range   Magnesium 1.9 1.7 - 2.4 mg/dL    Comment: Performed at [facility], [address] [place], [place] [address]  I-stat chem 8, ED     Status: Abnormal   Collection Time: 07/31/22  9:23 AM  Result Value Ref Range   Sodium 139 135 - 145 mmol/L   Potassium 3.5 3.5 - 5.1 mmol/L   Chloride 104 98 - 111 mmol/L   BUN 24 (H) 8 - 23 mg/dL   Creatinine, Ser 2.440.50 (L) 0.61 - 1.24 mg/dL   Glucose, Bld 67 (L) 70 - 99 mg/dL    Comment: Glucose reference range applies only to samples taken after fasting for at least 8 hours.   Calcium, Ion 1.05 (L) 1.15 - 1.40 mmol/L   TCO2 26 22 - 32 mmol/L   Hemoglobin 12.6 (L) 13.0 - 17.0 g/dL   HCT 01.037.0 (L) 27.239.0 - 53.652.0 %   CT Soft Tissue Neck W Contrast  Result Date: 07/31/2022 CLINICAL DATA:  Dysphagia EXAM: CT NECK WITH CONTRAST TECHNIQUE: Multidetector  CT imaging of the neck was performed using the standard protocol following the bolus administration of intravenous contrast. RADIATION DOSE REDUCTION: This exam was performed according to  the departmental dose-optimization program which includes automated exposure control, adjustment of the mA and/or kV according to patient size and/or use of iterative reconstruction technique. CONTRAST:  75mL OMNIPAQUE IOHEXOL 350 MG/ML SOLN COMPARISON:  Cervical spine CT 07/27/2022 FINDINGS: Pharynx and larynx: The nasal cavity and nasopharynx are unremarkable. There is mucosal edema along the lower aspect of the posterior oropharyngeal/hypopharyngeal wall (3-81). The oral cavity and oropharynx are otherwise unremarkable. The parapharyngeal spaces are clear. The vocal folds are normal in appearance.  The epiglottis is normal. The patient is status post recent C3 through C7 ACDF. There is retropharyngeal fluid measuring up to 9 mm in thickness at the C2 level. This is contiguous with a fluid collection in the left neck along the left aspect of the laryngeal cartilage. There is peripheral enhancement around this collection as well as small locules of gas. The collection measures up to approximately 3.9 cm AP x 1.9 cm TV x 6.1 cm CC at the level of the cricoid cartilage. The retropharyngeal portion of the fluid collection extends from the C2 through C7 levels measuring up to approximately 8.0 cm. The collection extends posterior to the esophagus without mass effect. The collection is overall grossly similar in size to the prior CT cervical spine but with slightly decreased gas. Salivary glands: The parotid and submandibular glands are unremarkable. Thyroid: Unremarkable. Lymph nodes: There is no pathologic lymphadenopathy in the neck. Vascular: There is calcified plaque of the carotid bifurcations bilaterally with high-grade stenosis of the proximal right internal carotid artery. There is also probable hemodynamically significant  stenosis on the left. Limited intracranial: The imaged intracranial compartment is unremarkable. Visualized orbits: The globes and orbits are unremarkable. Mastoids and visualized paranasal sinuses: Clear. Skeleton: Postsurgical changes reflecting recent C3 through C7 ACDF are noted. There is no evidence of hardware related complication. There are calcified loose bodies anterior to the right scapula. Upper chest: The imaged lung apices are clear. Other: None. IMPRESSION: 1. Postsurgical changes reflecting recent C3 through C7 ACDF with a peripherally enhancing collection in the left neck extending to the retropharynx with small internal locules of gas and mucosal edema along the adjacent posterior oropharynx/hypopharynx. The collection is grossly similar in size compared to the recent CT cervical spine and again may reflect a postoperative seroma; however, given peripheral enhancement, abscess is not excluded. 2. Calcified plaque of the carotid bifurcations bilaterally with high-grade stenosis on the right and probable hemodynamically significant stenosis on the left. Consider nonemergent CTA or carotid doppler for better evaluation. Electronically Signed   By: Lesia Hausen M.D.   On: 07/31/2022 11:15   DG Chest 2 View  Result Date: 07/31/2022 CLINICAL DATA:  Cough and shortness of breath. EXAM: CHEST - 2 VIEW COMPARISON:  07/27/2022 FINDINGS: Heart size and mediastinal contours are unremarkable. Asymmetric elevation of the left hemidiaphragm, unchanged. Similar appearance of left-sided pleural thickening. No airspace disease, interstitial edema or pneumothorax. IMPRESSION: 1. No active cardiopulmonary abnormalities. 2. Stable left-sided pleural thickening. Electronically Signed   By: Signa Kell M.D.   On: 07/31/2022 09:03    Pending Labs Unresulted Labs (From admission, onward)     Start     Ordered   08/07/22 0500  Creatinine, serum  (enoxaparin (LOVENOX)  CrCl >/= 30 mL/min  )  Weekly,   R      Comments: while on enoxaparin therapy.    07/31/22 1640   07/31/22 1641  CBC  (enoxaparin (LOVENOX)  CrCl >/= 30 mL/min  )  Once,  R       Comments: Baseline for enoxaparin therapy IF NOT ALREADY DRAWN. Notify MD if PLT < 100 K.    07/31/22 1640   07/31/22 1641  Creatinine, serum  (enoxaparin (LOVENOX)  CrCl >/= 30 mL/min  )  Once,   R       Comments: Baseline for enoxaparin therapy IF NOT ALREADY DRAWN.    07/31/22 1640            Vitals/Pain Today's Vitals   07/31/22 1501 07/31/22 1523 07/31/22 1641 07/31/22 1650  BP:      Pulse: 72     Resp: 16     Temp:  98.4 F (36.9 C)  99.2 F (37.3 C)  TempSrc:  Oral  Oral  SpO2: 92%     Weight:   190 lb (86.2 kg)   Height:   5\' 9"  (1.753 m)   PainSc:        Isolation Precautions No active isolations  Medications Medications  lactated ringers infusion (has no administration in time range)  sodium chloride flush (NS) 0.9 % injection 3 mL (has no administration in time range)  sodium chloride flush (NS) 0.9 % injection 3 mL (has no administration in time range)  0.9 %  sodium chloride infusion (has no administration in time range)  acetaminophen (TYLENOL) tablet 650 mg (has no administration in time range)    Or  acetaminophen (TYLENOL) suppository 650 mg (has no administration in time range)  methocarbamol (ROBAXIN) tablet 500 mg (has no administration in time range)    Or  methocarbamol (ROBAXIN) 500 mg in dextrose 5 % 50 mL IVPB (has no administration in time range)  ondansetron (ZOFRAN) tablet 4 mg (has no administration in time range)    Or  ondansetron (ZOFRAN) injection 4 mg (has no administration in time range)  dexamethasone (DECADRON) injection 4 mg (has no administration in time range)    Or  dexamethasone (DECADRON) tablet 4 mg (has no administration in time range)  menthol-cetylpyridinium (CEPACOL) lozenge 3 mg (has no administration in time range)    Or  phenol (CHLORASEPTIC) mouth spray 1 spray (has no  administration in time range)  enoxaparin (LOVENOX) injection 40 mg (has no administration in time range)  morphine (PF) 2 MG/ML injection 2 mg (has no administration in time range)  sodium phosphate (FLEET) 7-19 GM/118ML enema 1 enema (has no administration in time range)  atorvastatin (LIPITOR) tablet 40 mg (has no administration in time range)  DULoxetine (CYMBALTA) DR capsule 60 mg (has no administration in time range)  sodium chloride 0.9 % bolus 1,000 mL (0 mLs Intravenous Stopped 07/31/22 1145)  ondansetron (ZOFRAN) injection 4 mg (4 mg Intravenous Given 07/31/22 0902)  morphine (PF) 4 MG/ML injection 4 mg (4 mg Intravenous Given 07/31/22 0902)  iohexol (OMNIPAQUE) 350 MG/ML injection 75 mL (75 mLs Intravenous Contrast Given 07/31/22 1100)  morphine (PF) 4 MG/ML injection 4 mg (4 mg Intravenous Given 07/31/22 1356)    Mobility walks     Focused Assessments Had hx of throat cancer and had radiation per patient had difficulty swallowing before C spine surgery but now can't swallow and PT OT wants him NPO.  Was diagnosed on 4/4 with PEs and couldn't take xarelto so came back and now started on lovenox.    R Recommendations: See Admitting Provider Note  Report given to:   Additional Notes:

## 2022-07-31 NOTE — Evaluation (Signed)
Clinical/Bedside Swallow Evaluation Patient Details  Name: Spencer Cunningham MRN: [id number] Date of Birth: [birth year]  Today's Date: 07/31/2022 Time: SLP Start Time (ACUTE ONLY): 1623 SLP Stop Time (ACUTE ONLY): 1649 SLP Time Calculation (min) (ACUTE ONLY): 26 min  Past Medical History:  Past Medical History:  Diagnosis Date   Arthritis    OA- "everywhere"    Cancer    tonsil   Chronic kidney disease    passed renal calculi  h/o , passed spontaneously   Depression    Peripheral vascular disease    Pulmonary embolism 06/22/2012   all care has been in [facility], treated first /w Heparin , then Coumadin & now Xarelto     Past Surgical History:  Past Surgical History:  Procedure Laterality Date   ANTERIOR CERVICAL DECOMPRESSION/DISCECTOMY FUSION 4 LEVELS N/A 07/20/2022   Procedure: ANTERIOR CERVICAL DECOMPRESSION/DISCECTOMY FUSION 4 LEVELS C3-C7;  Surgeon: Venita Lick, MD;  Location: [facility] OR;  Service: Orthopedics;  Laterality: N/A;  4.5 hrs 3 C-Bed   BACK SURGERY  2008   lumbar   CERVICAL LAMINECTOMY  2002   HIP RESECTION ARTHROPLASTY Right 2000   PORTACATH PLACEMENT Bilateral 2014   SHOULDER ARTHROSCOPY W/ ROTATOR CUFF REPAIR Right 2010   TOTAL HIP ARTHROPLASTY Left 10/17/2013   Procedure: LEFT TOTAL HIP ARTHROPLASTY ANTERIOR APPROACH;  Surgeon: Loanne Drilling, MD;  Location: [facility] OR;  Service: Orthopedics;  Laterality: Left;   HPI:  Pt is a 67 yo male presenting to ED 4/8 with worsening dysphagia s/p ACDF C3-7 (3/28). Recently seen in ED 4/4 c/o neck pain and difficulty breathing and was found to have bilateral PE and d/c with Xarelto. CT Soft Tissue Neck revealed possible postoperative seroma versus abscess. PMH includes arthritis, cancer (tonsil) s/p radiation, CKD, depression, PE    Assessment / Plan / Recommendation  Clinical Impression  Pt reports having worsening swallowing difficulties x2 days with new onset globus sensation and regurgitation. Oral motor exam WFL. He states  that he experiences reduced saliva production s/p radiation for h/n cancer and typically only consumes "liquids and fatty foods". He also reports noticing changes in his vocal quality since his swallowing has declined. Pt had significant coughing and throat clearing immediately following trials of ice chips, thin liquids, and purees. He exhibited multiple swallows with all trials. With thin liquids, pt coughed after swallowing and a minimal amount of the bolus was regurgitated through his nose. Recommend NPO except for intermittent ice chips and meds via alternative means. SLP will f/u to assess potential timing for MBS given pt's baseline dysphagia as it may be impacting his ability to compensate for edema s/p ACDF. SLP Visit Diagnosis: Dysphagia, unspecified (R13.10)    Aspiration Risk  Moderate aspiration risk    Diet Recommendation Ice chips PRN after oral care   Liquid Administration via: Spoon Medication Administration: Via alternative means Supervision: Full supervision/cueing for compensatory strategies Postural Changes: Seated upright at 90 degrees    Other  Recommendations Oral Care Recommendations: Oral care prior to ice chip/H20    Recommendations for follow up therapy are one component of a multi-disciplinary discharge planning process, led by the attending physician.  Recommendations may be updated based on patient status, additional functional criteria and insurance authorization.  Follow up Recommendations Home health SLP      Assistance Recommended at Discharge    Functional Status Assessment Patient has had a recent decline in their functional status and demonstrates the ability to make significant improvements in function in a reasonable  and predictable amount of time.  Frequency and Duration min 2x/week  2 weeks       Prognosis Prognosis for improved oropharyngeal function: Good Barriers to Reach Goals: Time post onset      Swallow Study   General HPI: Pt is a 67  yo male presenting to ED 4/8 with worsening dysphagia s/p ACDF C3-7 (3/28). Recently seen in ED 4/4 c/o neck pain and difficulty breathing and was found to have bilateral PE and d/c with Xarelto. CT Soft Tissue Neck revealed possible postoperative seroma versus abscess. PMH includes arthritis, cancer (tonsil) s/p radiation, CKD, depression, PE Type of Study: Bedside Swallow Evaluation Previous Swallow Assessment: none in chart Diet Prior to this Study: NPO Temperature Spikes Noted: No Respiratory Status: Room air History of Recent Intubation: No Behavior/Cognition: Alert;Cooperative;Pleasant mood Oral Cavity Assessment: Within Functional Limits Oral Care Completed by SLP: No Oral Cavity - Dentition: Adequate natural dentition Vision: Functional for self-feeding Self-Feeding Abilities: Able to feed self Patient Positioning: Upright in bed Baseline Vocal Quality: Breathy;Hoarse Volitional Cough: Weak Volitional Swallow: Able to elicit    Oral/Motor/Sensory Function Overall Oral Motor/Sensory Function: Within functional limits   Ice Chips Ice chips: Impaired Presentation: Spoon;Self Fed Pharyngeal Phase Impairments: Cough - Immediate;Throat Clearing - Immediate;Wet Vocal Quality;Multiple swallows   Thin Liquid Thin Liquid: Impaired Presentation: Straw Pharyngeal  Phase Impairments: Throat Clearing - Immediate;Cough - Immediate;Wet Vocal Quality;Multiple swallows    Nectar Thick Nectar Thick Liquid: Not tested   Honey Thick Honey Thick Liquid: Not tested   Puree Puree: Impaired Presentation: Spoon;Self Fed Pharyngeal Phase Impairments: Cough - Immediate;Multiple swallows;Wet Vocal Quality   Solid     Solid: Not tested     Brunilda Payor., Student SLP  07/31/2022,5:24 PM

## 2022-07-31 NOTE — ED Notes (Signed)
Paged Dr Shon Baton via [facility] office and no returned called as of 1730.  Called [facility] again to page MD awaiting call due to orders not being correct per pharm to verify

## 2022-07-31 NOTE — ED Provider Notes (Signed)
[place] [facility]ERGENCY DEPARTMENT AT [facility] Provider Note   CSN: [id number] Arrival date & time: 07/31/22  0932     History  Chief Complaint  Patient presents with   Dysphagia    Spencer Cunningham is a 67 y.o. male.  Patient is a 67 year old male who presents with difficulty swallowing.  He had an anterior cervical discectomy/fusion on March 28 by Dr. Shon Baton.  He had started having some shortness of breath last week and was seen in the ED and diagnosed with bilateral subsegmental PEs.  He was started on Xarelto and treated as an outpatient.  He said that the following day started having some increased difficulty swallowing.  Over the last 2 days he has not been able to eat or drink anything.  He has not been able to swallow his medications.  He feels like it gets stuck when he swallowing and then he has to cough it back up.  He feels like it is going down the wrong pipe.  He denies any nausea or vomiting.  No fevers.  He feels like he has some increased swelling to his neck.  He has had some shortness of breath since Thursday when he was diagnosed with PEs but has not progressed since that time.  Does feel like he has some gurgling in his lungs.       Home Medications Prior to Admission medications   Medication Sig Start Date End Date Taking? Authorizing Provider  albuterol (VENTOLIN HFA) 108 (90 Base) MCG/ACT inhaler Inhale 1-2 puffs into the lungs every 6 (six) hours as needed for wheezing or shortness of breath. Patient not taking: Reported on 07/11/2022 03/16/22 04/15/22  Loetta Rough, MD  atorvastatin (LIPITOR) 40 MG tablet Take 40 mg by mouth in the morning. 03/08/22   [provider]  DULoxetine (CYMBALTA) 60 MG capsule Take 60 mg by mouth in the morning.    [provider]  morphine (MSIR) 15 MG tablet Take 0.5 tablets (7.5 mg total) by mouth every 4 (four) hours as needed for severe pain. 07/27/22   Melene Plan, DO  ondansetron (ZOFRAN) 4 MG tablet  Take 1 tablet (4 mg total) by mouth every 8 (eight) hours as needed for nausea or vomiting. 07/20/22   Venita Lick, MD  Rivaroxaban (XARELTO) 15 MG TABS tablet Take 1 tablet (15 mg total) by mouth 2 (two) times daily for 21 days. 07/27/22 08/17/22  Melene Plan, DO  rivaroxaban (XARELTO) 20 MG TABS tablet Take 20 mg by mouth every evening.    [provider]  traZODone (DESYREL) 100 MG tablet Take 100 mg by mouth at bedtime as needed for sleep.    [provider]      Allergies    Penicillins    Review of Systems   Review of Systems  Constitutional:  Negative for chills, diaphoresis, fatigue and fever.  HENT:  Positive for trouble swallowing. Negative for congestion, rhinorrhea, sneezing and voice change.   Eyes: Negative.   Respiratory:  Positive for cough and shortness of breath. Negative for chest tightness.   Cardiovascular:  Negative for chest pain and leg swelling.  Gastrointestinal:  Negative for abdominal pain, blood in stool, diarrhea, nausea and vomiting.  Genitourinary:  Negative for difficulty urinating, flank pain, frequency and hematuria.  Musculoskeletal:  Positive for neck pain. Negative for arthralgias and back pain.  Skin:  Negative for rash.  Neurological:  Negative for dizziness, speech difficulty, weakness, numbness and headaches.  Physical Exam Updated Vital Signs BP 123/81   Pulse 74   Temp 98.1 F (36.7 C)   Resp 14   SpO2 94%  Physical Exam Constitutional:      Appearance: He is well-developed.  HENT:     Head: Normocephalic and atraumatic.  Eyes:     Pupils: Pupils are equal, round, and reactive to light.  Neck:     Comments: Mild swelling to the left neck.  Well-healing incision.  No drainage.  No bleeding.  No ecchymosis. Cardiovascular:     Rate and Rhythm: Normal rate and regular rhythm.     Heart sounds: Normal heart sounds.  Pulmonary:     Effort: Pulmonary effort is normal. No respiratory distress.     Breath sounds:  Normal breath sounds. No wheezing or rales.  Chest:     Chest wall: No tenderness.  Abdominal:     General: Bowel sounds are normal.     Palpations: Abdomen is soft.     Tenderness: There is no abdominal tenderness. There is no guarding or rebound.  Musculoskeletal:        General: Normal range of motion.     Cervical back: Normal range of motion and neck supple.  Lymphadenopathy:     Cervical: No cervical adenopathy.  Skin:    General: Skin is warm and dry.     Findings: No rash.  Neurological:     Mental Status: He is alert and oriented to person, place, and time.     ED Results / Procedures / Treatments   Labs (all labs ordered are listed, but only abnormal results are displayed) Labs Reviewed  BASIC METABOLIC PANEL - Abnormal; Notable for the following components:      Result Value   Sodium 132 (*)    Calcium 8.8 (*)    All other components within normal limits  PROTIME-INR - Abnormal; Notable for the following components:   Prothrombin Time 24.0 (*)    INR 2.2 (*)    All other components within normal limits  I-STAT CHEM 8, ED - Abnormal; Notable for the following components:   BUN 24 (*)    Creatinine, Ser 0.50 (*)    Glucose, Bld 67 (*)    Calcium, Ion 1.05 (*)    Hemoglobin 12.6 (*)    HCT 37.0 (*)    All other components within normal limits  CBC WITH DIFFERENTIAL/PLATELET  MAGNESIUM    EKG None  Radiology CT Soft Tissue Neck W Contrast  Result Date: 07/31/2022 CLINICAL DATA:  Dysphagia EXAM: CT NECK WITH CONTRAST TECHNIQUE: Multidetector CT imaging of the neck was performed using the standard protocol following the bolus administration of intravenous contrast. RADIATION DOSE REDUCTION: This exam was performed according to the departmental dose-optimization program which includes automated exposure control, adjustment of the mA and/or kV according to patient size and/or use of iterative reconstruction technique. CONTRAST:  77mL OMNIPAQUE IOHEXOL 350 MG/ML  SOLN COMPARISON:  Cervical spine CT 07/27/2022 FINDINGS: Pharynx and larynx: The nasal cavity and nasopharynx are unremarkable. There is mucosal edema along the lower aspect of the posterior oropharyngeal/hypopharyngeal wall (3-81). The oral cavity and oropharynx are otherwise unremarkable. The parapharyngeal spaces are clear. The vocal folds are normal in appearance.  The epiglottis is normal. The patient is status post recent C3 through C7 ACDF. There is retropharyngeal fluid measuring up to 9 mm in thickness at the C2 level. This is contiguous with a fluid collection in the left neck along the left  aspect of the laryngeal cartilage. There is peripheral enhancement around this collection as well as small locules of gas. The collection measures up to approximately 3.9 cm AP x 1.9 cm TV x 6.1 cm CC at the level of the cricoid cartilage. The retropharyngeal portion of the fluid collection extends from the C2 through C7 levels measuring up to approximately 8.0 cm. The collection extends posterior to the esophagus without mass effect. The collection is overall grossly similar in size to the prior CT cervical spine but with slightly decreased gas. Salivary glands: The parotid and submandibular glands are unremarkable. Thyroid: Unremarkable. Lymph nodes: There is no pathologic lymphadenopathy in the neck. Vascular: There is calcified plaque of the carotid bifurcations bilaterally with high-grade stenosis of the proximal right internal carotid artery. There is also probable hemodynamically significant stenosis on the left. Limited intracranial: The imaged intracranial compartment is unremarkable. Visualized orbits: The globes and orbits are unremarkable. Mastoids and visualized paranasal sinuses: Clear. Skeleton: Postsurgical changes reflecting recent C3 through C7 ACDF are noted. There is no evidence of hardware related complication. There are calcified loose bodies anterior to the right scapula. Upper chest: The imaged  lung apices are clear. Other: None. IMPRESSION: 1. Postsurgical changes reflecting recent C3 through C7 ACDF with a peripherally enhancing collection in the left neck extending to the retropharynx with small internal locules of gas and mucosal edema along the adjacent posterior oropharynx/hypopharynx. The collection is grossly similar in size compared to the recent CT cervical spine and again may reflect a postoperative seroma; however, given peripheral enhancement, abscess is not excluded. 2. Calcified plaque of the carotid bifurcations bilaterally with high-grade stenosis on the right and probable hemodynamically significant stenosis on the left. Consider nonemergent CTA or carotid doppler for better evaluation. Electronically Signed   By: Lesia HausenPeter  Noone M.D.   On: 07/31/2022 11:15   DG Chest 2 View  Result Date: 07/31/2022 CLINICAL DATA:  Cough and shortness of breath. EXAM: CHEST - 2 VIEW COMPARISON:  07/27/2022 FINDINGS: Heart size and mediastinal contours are unremarkable. Asymmetric elevation of the left hemidiaphragm, unchanged. Similar appearance of left-sided pleural thickening. No airspace disease, interstitial edema or pneumothorax. IMPRESSION: 1. No active cardiopulmonary abnormalities. 2. Stable left-sided pleural thickening. Electronically Signed   By: Signa Kellaylor  Stroud M.D.   On: 07/31/2022 09:03    Procedures Procedures    Medications Ordered in ED Medications  potassium chloride 10 mEq in 100 mL IVPB (10 mEq Intravenous Not Given 07/31/22 1417)  atorvastatin (LIPITOR) tablet 40 mg (has no administration in time range)  DULoxetine (CYMBALTA) DR capsule 60 mg (has no administration in time range)  sodium chloride 0.9 % bolus 1,000 mL (0 mLs Intravenous Stopped 07/31/22 1145)  ondansetron (ZOFRAN) injection 4 mg (4 mg Intravenous Given 07/31/22 0902)  morphine (PF) 4 MG/ML injection 4 mg (4 mg Intravenous Given 07/31/22 0902)  iohexol (OMNIPAQUE) 350 MG/ML injection 75 mL (75 mLs Intravenous  Contrast Given 07/31/22 1100)  morphine (PF) 4 MG/ML injection 4 mg (4 mg Intravenous Given 07/31/22 1356)    ED Course/ Medical Decision Making/ A&P                             Medical Decision Making Amount and/or Complexity of Data Reviewed Labs: ordered. Radiology: ordered.  Risk Prescription drug management. Decision regarding hospitalization.   Patient is a 67 year old male who has difficulty swallowing after recent anterior cervical discectomy/fusion.  CT scan was performed which shows  some increased fluid collection in the left side of his neck although not significantly changed from the CT scan that was done a few days ago.  However clinically he is worse.  His labs are nonconcerning.  I discussed the case with Dr. Shon Baton who has seen the patient will admit him for further treatment.  He was given IV fluids as well.  Of note, he did have a 4 beat run of V. tach.  He was asymptomatic during this episode.  I did speak with Dr. Shari Prows with cardiology who recommended keeping his potassium at the high end of normal which it is actually over 4.  Also recommended checking a magnesium but did not think that the patient needed any further treatment for this.  Did not think that the patient needed outpatient follow-up for this.  Final Clinical Impression(s) / ED Diagnoses Final diagnoses:  Dysphagia, unspecified type    Rx / DC Orders ED Discharge Orders     None         Rolan Bucco, MD 07/31/22 1443

## 2022-08-01 ENCOUNTER — Inpatient Hospital Stay (HOSPITAL_COMMUNITY): Payer: Medicare Other

## 2022-08-01 MED ORDER — HYDROMORPHONE HCL 2 MG PO TABS
2.0000 mg | ORAL_TABLET | ORAL | Status: DC | PRN
  Administered 2022-08-01: 2 mg via ORAL
  Filled 2022-08-01 (×2): qty 1

## 2022-08-01 MED ORDER — METHOCARBAMOL 1000 MG/10ML IJ SOLN: 500.0000 mg | Freq: Four times a day (QID) | INTRAVENOUS | Status: DC | PRN

## 2022-08-01 MED ORDER — HYDROMORPHONE HCL 1 MG/ML IJ SOLN: 1.0000 mg | INTRAMUSCULAR | Status: DC | PRN

## 2022-08-01 NOTE — Progress Notes (Signed)
Modified Barium Swallow Study  Patient Details  Name: Spencer Cunningham MRN: [id number] Date of Birth: 08-08-1955  Today's Date: 08/01/2022  Modified Barium Swallow completed.  Full report located under Chart Review in the Imaging Section.  History of Present Illness Pt is a 67 yo male presenting to ED 4/8 with worsening dysphagia s/p ACDF C3-7 (3/28). Recently seen in ED 4/4 c/o neck pain and difficulty breathing and was found to have bilateral PE and d/c with Xarelto. CT Soft Tissue Neck revealed possible postoperative seroma versus abscess. PMH includes arthritis, cancer (tonsil) s/p radiation, CKD, depression, PE   Clinical Impression Pt presents with what is suspected to be an acute on chronic dysphagia with h/o swallowing difficulties from prior tonsilar ca s/p XRT, now with new changes from ACDF. 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 Otherwise his oral phase is generally functional. Pt also has little anterior hyoid movement and base of tongue retraction, as well as reduced laryngeal elevation and laryngeal vestibule closure. This is likely more related to changes from h/o XRT. Protrusion of posterior pharyngeal wall from post-operative changes further contribute to reduced epiglottic inversion and PES opening. When combined, these changes result in aspiration of thin liquids particularly via straw. He coughs frequently throughout the study so it is diffcult to discern if aspiration is "sensed" but his coughing is not effective at clearing his trachea. Pt has primarily penetration with thin liquids but his coughing and re-swallowing is more effective at clearing his laryngeal vestibule. Residue in the valleculae and pyriform sinuses increase as trials become more solid, with the majority of the bolus staying in the valleculae with a bite of a nutrigrain bar. The pt sensed its presence and despite its stickier texture, was able to  expectorate almost all of it orally. Recommend that pt gradually resume baseline textures but would not use a straw. Education was provided about small, single cup sips as well as starting with softer, moister foods (yogurt, pudding, soup, etc.) and working his way up to more solids foods. However, would avoid foods that gave him difficulty even at his baseline (meats). Pt and wife both verbalized their understanding.  Factors that may increase risk of adverse event in presence of aspiration [name] [name] 2021): Reduced saliva;Weak cough  Swallow Evaluation Recommendations Recommendations: PO diet PO Diet Recommendation: Dysphagia 3 (Mechanical soft);Thin liquids (Level 0) (encouraged him to start with very soft foods/liquids and advance from there, but would avoid foods like meats that gave him trouble at baseline) Liquid Administration via: Cup;No straw Medication Administration: Whole meds with liquid (may want to crush larger pills) Supervision: Patient able to self-feed;Intermittent supervision/cueing for swallowing strategies Swallowing strategies  : Slow rate;Small bites/sips (one sip at a time; cough out food if it feels stuck) Postural changes: Stay upright 30-60 min after meals;Position pt fully upright for meals Oral care recommendations: Oral care BID (2x/day)      [name]., M.A. CCC-SLP Acute Rehabilitation Services Office [phone]  Secure chat preferred  08/01/2022,2:43 PM

## 2022-08-01 NOTE — TOC Initial Note (Addendum)
Transition of Care [facility]) - Initial/Assessment Note    Patient Details  Name: Spencer Cunningham MRN: [id number] Date of Birth: Apr 20, 1956  [facility] of [facility]) CM/SW Contact:    Lawerance Sabal, RN Phone Number: 08/01/2022, 7:55 AM  Clinical Narrative:                  Patient is 2 weeks out from a 4 level ACDF with progressive dysphagia as well as a PE. IV steroids and pain meds. Lovenox until able to swallow Xaralto. No current HH agency visible in [place].  Anticipate return to home when able to tolerate PO.    Barriers to Discharge: Continued Medical Work up   Patient Goals and CMS Choice            Expected Discharge Plan and Services       Living arrangements for the past 2 months: Single Family Home                                      Prior Living Arrangements/Services Living arrangements for the past 2 months: Single Family Home                     Activities of Daily Living Home Assistive Devices/Equipment: C-collar (aspen collar) ADL Screening (condition at time of admission) Patient's cognitive ability adequate to safely complete daily activities?: Yes Is the patient deaf or have difficulty hearing?: No Does the patient have difficulty seeing, even when wearing glasses/contacts?: No Does the patient have difficulty concentrating, remembering, or making decisions?: No Patient able to express need for assistance with ADLs?: Yes Does the patient have difficulty dressing or bathing?: No Independently performs ADLs?: Yes (appropriate for developmental age) Does the patient have difficulty walking or climbing stairs?: No Weakness of Legs: None Weakness of Arms/Hands: None  Permission Sought/Granted                  Emotional Assessment              Admission diagnosis:  Dysphagia [R13.10] Dysphagia, unspecified type [R13.10] Patient Active Problem List   Diagnosis Date Noted   Dysphagia 07/31/2022   Cervical radiculopathy  07/20/2022   Postoperative anemia due to acute blood loss 10/18/2013   OA (osteoarthritis) of hip 10/17/2013   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 [phone] - [place], [place] - [address] AT [facility] [address] [place] [address] Phone: [phone] Fax: [phone]  [facility] [address] [place] [address] Phone: [phone] Fax: [phone]     Social Determinants of Health (SDOH) Social History: SDOH Screenings   Food Insecurity: No Food Insecurity (07/31/2022)  Housing: Low Risk  (07/31/2022)  Transportation Needs: No Transportation Needs (07/31/2022)  Utilities: Not At Risk (07/31/2022)  Tobacco Use: Medium Risk (07/27/2022)   SDOH Interventions:     Readmission Risk Interventions     No data to display

## 2022-08-01 NOTE — Progress Notes (Signed)
    Subjective:   Shalev is improvement in his swallowing and throat discomfort status post IV Decadron.  Continues to have some issues swallowing. Patient reports pain as 2 on 0-10 scale.  Reports decreased arm pain denies incisional neck pain   Positive void Negative bowel movement Negative flatus Negative chest pain or shortness of breath  Objective: Vital signs in last 24 hours: Temp:  [98.1 F (36.7 C)-99.2 F (37.3 C)] 98.8 F (37.1 C) (04/09 0508) Pulse Rate:  [62-83] 62 (04/09 0508) Resp:  [14-18] 16 (04/09 0508) BP: (117-146)/(75-94) 121/75 (04/09 0508) SpO2:  [91 %-96 %] 96 % (04/09 0508) Weight:  [86.2 kg] 86.2 kg (04/08 1641)  Intake/Output from previous day: 04/08 0701 - 04/09 0700 In: 280 [P.O.:240; I.V.:40] Out: 200 [Urine:200]  Labs: Recent Labs    07/31/22 0800 07/31/22 0923  WBC 8.5  --   RBC 4.43  --   HCT 44.1 37.0*  PLT 310  --    Recent Labs    07/31/22 0800 07/31/22 0923  NA 132* 139  K 4.3 3.5  CL 98 104  CO2 23  --   BUN 23 24*  CREATININE 0.89 0.50*  GLUCOSE 80 67*  CALCIUM 8.8*  --    Recent Labs    07/31/22 0800  INR 2.2*    Physical Exam: Neurologically intact Sensation intact distally Incision: dressing C/D/I Compartment soft Body mass index is 28.06 kg/m.  Assessment/Plan: Patient stable  Mobilization with physical therapy Encourage incentive spirometry Continue care  1.  Appreciate speech and swallow evaluation.  Will adjust diet per their recommendations. 2.  Continue using Lovenox as PE treatment until he is able to tolerate oral medications and can restart his Xarelto. 3.  Will continue to monitor his progress.  Venita Lick, MD [facility] [phone]

## 2022-08-01 NOTE — Progress Notes (Signed)
Speech Language Pathology Treatment: Dysphagia  Patient Details Name: Spencer Cunningham MRN: [id number] DOB: [birth year] Today's Date: 08/01/2022 Time: 8110-3159 SLP Time Calculation (min) (ACUTE ONLY): 27 min  Assessment / Plan / Recommendation Clinical Impression  Pt reports experiencing decreased regurgitation since session yesterday. Pt and his wife confirm baseline dysphagia and previously modified diet. They report pt was not previously able to eat tough foods and instead, he fully masticated them and expectorated them from his oral cavity before swallowing. Today, he was observed during trials of ice chips, thin liquids, and purees with noted throat clearing and coughing immediately after the swallow. RN provided pt's medications to be taken crushed in purees, with which pt exhibited multiple swallows. Although pt reports improvements, recommend proceeding with an MBS due to pt's history of dysphagia. Pt can continue to intermittently have ice chips pending MBS results.    HPI HPI: Pt is a 67 yo male presenting to ED 4/8 with worsening dysphagia s/p ACDF C3-7 (3/28). Recently seen in ED 4/4 c/o neck pain and difficulty breathing and was found to have bilateral PE and d/c with Xarelto. CT Soft Tissue Neck revealed possible postoperative seroma versus abscess. PMH includes arthritis, cancer (tonsil) s/p radiation, CKD, depression, PE      SLP Plan  MBS      Recommendations for follow up therapy are one component of a multi-disciplinary discharge planning process, led by the attending physician.  Recommendations may be updated based on patient status, additional functional criteria and insurance authorization.    Recommendations  Diet recommendations: NPO;Other(comment) (ice chips intermittently) Liquids provided via: Cup;Straw Medication Administration: Crushed with puree Supervision: Patient able to self feed;Full supervision/cueing for compensatory strategies Postural Changes and/or  Swallow Maneuvers: Seated upright 90 degrees;Upright 30-60 min after meal                  Oral care prior to ice chip/H20   PRN Dysphagia, unspecified (R13.10)     MBS     Brunilda Payor., Student SLP  08/01/2022, 12:11 PM

## 2022-08-02 ENCOUNTER — Other Ambulatory Visit (HOSPITAL_COMMUNITY): Payer: Self-pay

## 2022-08-02 MED ORDER — RIVAROXABAN 20 MG PO TABS: 20.0000 mg | ORAL_TABLET | Freq: Every day | ORAL | Status: DC

## 2022-08-02 MED ORDER — OXYCODONE-ACETAMINOPHEN 5-325 MG PO TABS
1.0000 | ORAL_TABLET | Freq: Four times a day (QID) | ORAL | Status: DC | PRN
  Administered 2022-08-02: 2 via ORAL
  Administered 2022-08-02: 1 via ORAL
  Filled 2022-08-02: qty 1
  Filled 2022-08-02: qty 2

## 2022-08-02 MED ORDER — RIVAROXABAN 15 MG PO TABS
15.0000 mg | ORAL_TABLET | Freq: Two times a day (BID) | ORAL | Status: DC
  Administered 2022-08-02 – 2022-08-03 (×3): 15 mg via ORAL
  Filled 2022-08-02 (×4): qty 1

## 2022-08-02 NOTE — Progress Notes (Signed)
SLP Contact Note  Patient Details Name: Spencer Cunningham MRN: [id number] DOB: 08-17-1955   Cancelled treatment:        RN reports improved PO intake.  Spoke with pt who states he has returned 100% to his baseline.  He was able to speak to how MBS study yesterday has given him insight to chronic deficits.  He at soft foods this morning: yogurt and oatmeal with milk and even a muffin using milk to moisten as needed.  Pt is utilizing compensatory strategies independently and states he does not need further ST intervention.  Pt expected to d/c today.  SLP will sign off.  Pt has no further ST needs.   Kerrie Pleasure, MA, CCC-SLP Acute Rehabilitation Services Office: [phone] 08/02/2022, 9:54 AM

## 2022-08-02 NOTE — Progress Notes (Addendum)
ANTICOAGULATION CONSULT NOTE -  follow up  Pharmacy Consult for:   Transitione form Enoxaparinto Xarelto Indication: pulmonary embolus  Allergies  Allergen Reactions   Penicillins Other (See Comments)    Childhood allergy., rash    Patient Measurements: Height: 5\' 9"  (175.3 cm) Weight: 86.2 kg (190 lb) IBW/kg (Calculated) : 70.7  Vital Signs:    Labs: Recent Labs    07/31/22 0800 07/31/22 0923  HGB 14.4 12.6*  HCT 44.1 37.0*  PLT 310  --   LABPROT 24.0*  --   INR 2.2*  --   CREATININE 0.89 0.50*     Estimated Creatinine Clearance: 97.5 mL/min (A) (by C-G formula based on SCr of 0.5 mg/dL (L)).   Medical History: Past Medical History:  Diagnosis Date   Arthritis    OA- "everywhere"    Cancer    tonsil   Chronic kidney disease    passed renal calculi  h/o , passed spontaneously   Depression    Peripheral vascular disease    Pulmonary embolism 06/22/2012   all care has been in [facility], treated first /w Heparin , then Coumadin & now Xarelto      Medications:  Medications Prior to Admission  Medication Sig Dispense Refill Last Dose   atorvastatin (LIPITOR) 40 MG tablet Take 40 mg by mouth in the morning.   Past Week   DULoxetine (CYMBALTA) 60 MG capsule Take 60 mg by mouth in the morning.   Past Week   morphine (MSIR) 15 MG tablet Take 0.5 tablets (7.5 mg total) by mouth every 4 (four) hours as needed for severe pain. 7 tablet 0 Past Week   ondansetron (ZOFRAN) 4 MG tablet Take 1 tablet (4 mg total) by mouth every 8 (eight) hours as needed for nausea or vomiting. 20 tablet 0 unknown   Rivaroxaban (XARELTO) 15 MG TABS tablet Take 1 tablet (15 mg total) by mouth 2 (two) times daily for 21 days. 42 tablet 0 07/30/2022 at pm   traZODone (DESYREL) 100 MG tablet Take 100 mg by mouth at bedtime as needed for sleep.   unknown   albuterol (VENTOLIN HFA) 108 (90 Base) MCG/ACT inhaler Inhale 1-2 puffs into the lungs every 6 (six) hours as needed for wheezing or shortness of  breath. (Patient not taking: Reported on 07/11/2022) 1 each 0 Not Taking   Scheduled:   atorvastatin  40 mg Oral q AM   DULoxetine  60 mg Oral q AM   mouth rinse  15 mL Mouth Rinse 4 times per day   rivaroxaban  15 mg Oral BID WC   Followed by   Melene Muller ON 08/17/2022] rivaroxaban  20 mg Oral Q supper   sodium chloride flush  3 mL Intravenous Q12H    Assessment: 67 YO male presenting  07/31/22 with progressive dysphagia. He has a recent history of PE  diagnosed on 07/27/22 ) at [facility] ED and was discharged on Xarelto.  First dose given in ED,  Milinda Hirschfeld 15mg  BID x21 days prescribed on discharge.  Due to the patient's dysphagia, he has been unable to take his Xarelto today. His last reported dose of Xarelto was 4/7 at 1800. Pharmacy was consulted 4/8 for Lovenox treatment dosing.  Today 08/02/22 pharmacy consulted to transition back to Xarelto for PE.   Since reported that he was unable to take his Xarelto for a time prior to admission, we will use 4/8 as start date counting the lovenox treatment dosing.  Hgb 12.6, Plts 310 on  07/31/22.  No signs/symptoms of bleeding reported.   ADDENDUM:  NOTE:  patient reports he has been on Xarelto since 2014 for h/o PE post cancer treatment, but Xarelto was held prior to  cervical diskectomy 07/20/22.  On 07/27/22 found the new PE and his  Xarelto was increased to 15mg  BID on 07/27/22 at [facility] ER.   Goals: Monitor platelets by anticoagulation protocol: Yes   Plan:  Discontinue  Lovenox now and resume Xarelto 15mg  BID x 19 days (using 4/8 as start date counting lovenox treatment) then on 08/21/22 the dose will be 20mg  daily with supper.  Monitor CBC, s/sx of bleeding daily   Thank you for allowing pharmacy to be part of this patients care team. Noah Delaine, RPh Clinical Pharmacist  4/10/20248:34 AM  Please check AMION for all [facility] Pharmacy phone numbers After 10:00 PM, call Main Pharmacy [phone]  ADDENDUM:  -Xarelto education completed 4/10,  pt very  familiar with Xarelto as on since 2014. He understands dose increased to 15mg  bid for acute PE,and on 4/29 dose to change to 20mg  daily, He confirms he has both strengths at home.   ([facility] RX for 15mg  tabs 07/27/22 and 90 day supply for Xarelto 20mg  tablets filled  05/29/2022 from [facility] Mail order )  AVS posted,  copay is  $47.00   Noah Delaine, RPh Clinical Pharmacist 08/02/2022 7:01 PM

## 2022-08-02 NOTE — Progress Notes (Signed)
    Subjective: Patient's dysphagia as well as his left shoulder pain is significantly improved. Patient reports pain as 4 on 0-10 scale.  Reports decreased arm pain denies incisional neck pain   Positive void Negative bowel movement Positive flatus Negative chest pain or shortness of breath  Objective: Vital signs in last 24 hours: Temp:  [97.9 F (36.6 C)-98.3 F (36.8 C)] 98.3 F (36.8 C) (04/09 2005) Pulse Rate:  [72-82] 76 (04/09 2005) Resp:  [17] 17 (04/09 2005) BP: (125-142)/(81-88) 136/81 (04/09 2005) SpO2:  [92 %-96 %] 94 % (04/09 2005)  Intake/Output from previous day: 04/09 0701 - 04/10 0700 In: 1691 [P.O.:120; I.V.:1571] Out: 879 [Urine:878; Stool:1]  Labs: Recent Labs    07/31/22 0800 07/31/22 0923  WBC 8.5  --   RBC 4.43  --   HCT 44.1 37.0*  PLT 310  --    Recent Labs    07/31/22 0800 07/31/22 0923  NA 132* 139  K 4.3 3.5  CL 98 104  CO2 23  --   BUN 23 24*  CREATININE 0.89 0.50*  GLUCOSE 80 67*  CALCIUM 8.8*  --    Recent Labs    07/31/22 0800  INR 2.2*    Physical Exam: Neurologically intact ABD soft Intact pulses distally Incision: dressing C/D/I and no drainage Compartment soft Body mass index is 28.06 kg/m.  Assessment/Plan: Patient stable  Mobilization with physical therapy Encourage incentive spirometry Continue care  Patient completed his barium swallow.  I have reviewed the swallow evaluation recommendations.  His p.o. diet recommendations will be adjusted.  Today we will DC his IV narcotic pain medications and put him on oral medications.  If he does well then we will plan on discharge to home tomorrow.  Venita Lick, MD [facility] [phone]

## 2022-08-02 NOTE — Discharge Instructions (Addendum)
Information on my medicine - XARELTO (rivaroxaban)  This medication education was reviewed with me or my healthcare representative as part of my discharge preparation.   WHY WAS XARELTO PRESCRIBED FOR YOU? Xarelto was prescribed to treat blood clots that may have been found in the veins of your legs (deep vein thrombosis) or in your lungs (pulmonary embolism) and to reduce the risk of them occurring again.  What do you need to know about Xarelto?  The starting dose is one 15 mg tablet taken TWICE daily with food for the 19 DAYS (08/02/22 thru 08/20/22) then on August 21, 2022  the dose is changed to one 20 mg tablet taken ONCE A DAY with your evening meal.  DO NOT stop taking Xarelto without talking to the health care provider who prescribed the medication.  Refill your prescription for 20 mg tablets before you run out.  After discharge, you should have regular check-up appointments with your healthcare provider that is prescribing your Xarelto.  In the future your dose may need to be changed if your kidney function changes by a significant amount.  What do you do if you miss a dose? If you are taking Xarelto TWICE DAILY and you miss a dose, take it as soon as you remember. You may take two 15 mg tablets (total 30 mg) at the same time then resume your regularly scheduled 15 mg twice daily the next day.  If you are taking Xarelto ONCE DAILY and you miss a dose, take it as soon as you remember on the same day then continue your regularly scheduled once daily regimen the next day. Do not take two doses of Xarelto at the same time.   Important Safety Information Xarelto is a blood thinner medicine that can cause bleeding. You should call your healthcare provider right away if you experience any of the following: Bleeding from an injury or your nose that does not stop. Unusual colored urine (red or dark brown) or unusual colored stools (red or black). Unusual bruising for unknown reasons. A  serious fall or if you hit your head (even if there is no bleeding).  Some medicines may interact with Xarelto and might increase your risk of bleeding while on Xarelto. To help avoid this, consult your healthcare provider or pharmacist prior to using any new prescription or non-prescription medications, including herbals, vitamins, non-steroidal anti-inflammatory drugs (NSAIDs) and supplements.  This website has more information on Xarelto: [email or web address].br.   DOSING FOR XARELTO:  15MG  TWICE A DAY UNTIL 4/28.   On 4/29 CHANGE TO 20MG  (1 TAB) PER DAY.

## 2022-08-03 NOTE — Discharge Summary (Signed)
Patient ID: Spencer Cunningham MRN: [id number] DOB/AGE: 68-Aug-1957 68 y.o.  Admit date: 07/31/2022 Discharge date: 08/03/2022  Admission Diagnoses:  Principal Problem:   Dysphagia   Discharge Diagnoses:  Principal Problem:   Dysphagia  status post   Past Medical History:  Diagnosis Date   Arthritis    OA- "everywhere"    Cancer    tonsil   Chronic kidney disease    passed renal calculi  h/o , passed spontaneously   Depression    Peripheral vascular disease    Pulmonary embolism 06/22/2012   all care has been in [facility], treated first /w Heparin , then Coumadin & now Xarelto      Surgeries:  on * No surgery found *   Consultants: Treatment Team:  Venita Lick, MD  Discharged Condition: Improved  Hospital Course: Spencer Cunningham is an 67 y.o. male who was admitted 07/31/2022 for operative treatment of Dysphagia. Patient failed conservative treatments (please see the history and physical for the specifics) and had severe unremitting pain that affects sleep, daily activities and work/hobbies.  Anti-infectives (From admission, onward)    None        Patient was given sequential compression devices and early ambulation to prevent DVT.   Patient benefited maximally from hospital stay and there were no complications. At the time of discharge, the patient was urinating/moving their bowels without difficulty, tolerating a regular diet, pain is controlled with oral pain medications and they have been cleared by PT/OT.   Recent vital signs: Patient Vitals for the past 24 hrs:  BP Temp Temp src Pulse Resp SpO2  08/03/22 0543 (!) 145/81 97.8 F (36.6 C) -- 64 18 97 %  08/02/22 2300 117/76 97.7 F (36.5 C) -- 65 18 100 %  08/02/22 1423 116/74 98.3 F (36.8 C) Oral 70 18 95 %     Recent laboratory studies: No results for input(s): "WBC", "HGB", "HCT", "PLT", "NA", "K", "CL", "CO2", "BUN", "CREATININE", "GLUCOSE", "INR", "CALCIUM" in the last 72 hours.  Invalid input(s):  "PT", "2"   Discharge Medications:   Allergies as of 08/03/2022       Reactions   Penicillins Other (See Comments)   Childhood allergy., rash        Medication List     STOP taking these medications    albuterol 108 (90 Base) MCG/ACT inhaler Commonly known as: VENTOLIN HFA       TAKE these medications    atorvastatin 40 MG tablet Commonly known as: LIPITOR Take 40 mg by mouth in the morning.   DULoxetine 60 MG capsule Commonly known as: CYMBALTA Take 60 mg by mouth in the morning.   Rivaroxaban 15 MG Tabs tablet Commonly known as: XARELTO Take 1 tablet (15 mg total) by mouth 2 (two) times daily for 21 days.   traZODone 100 MG tablet Commonly known as: DESYREL Take 100 mg by mouth at bedtime as needed for sleep.        Diagnostic Studies: DG Swallowing Func-Speech Pathology  Result Date: 08/01/2022 Table formatting from the original result was not included. Modified Barium Swallow Study Patient Details Name: Spencer Cunningham MRN: [id number] Date of Birth: Jan 26, 1956 Today's Date: 08/01/2022 HPI/PMH: HPI: Pt is a 67 yo male presenting to ED 4/8 with worsening dysphagia s/p ACDF C3-7 (3/28). Recently seen in ED 4/4 c/o neck pain and difficulty breathing and was found to have bilateral PE and d/c with Xarelto. CT Soft Tissue Neck revealed possible postoperative seroma versus abscess.  PMH includes arthritis, cancer (tonsil) s/p radiation, CKD, depression, PE Clinical Impression: Clinical Impression: Pt presents with what is suspected to be an acute on chronic dysphagia with h/o swallowing difficulties from prior tonsilar ca s/p XRT, now with new changes from ACDF. 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 Otherwise his oral phase is generally functional. Pt also has little anterior hyoid movement and base of tongue retraction, as well as reduced laryngeal elevation and laryngeal vestibule closure. This is likely  more related to changes from h/o XRT. Protrusion of posterior pharyngeal wall from post-operative changes further contribute to reduced epiglottic inversion and PES opening. When combined, these changes result in aspiration of thin liquids particularly via straw. He coughs frequently throughout the study so it is diffcult to discern if aspiration is "sensed" but his coughing is not effective at clearing his trachea. Pt has primarily penetration with thin liquids but his coughing and re-swallowing is more effective at clearing his laryngeal vestibule. Residue in the valleculae and pyriform sinuses increase as trials become more solid, with the majority of the bolus staying in the valleculae with a bite of a nutrigrain bar. The pt sensed its presence and despite its stickier texture, was able to expectorate almost all of it orally. Recommend that pt gradually resume baseline textures but would not use a straw. Education was provided about small, single cup sips as well as starting with softer, moister foods (yogurt, pudding, soup, etc.) and working his way up to more solids foods. However, would avoid foods that gave him difficulty even at his baseline (meats). Pt and wife both verbalized their understanding. Factors that may increase risk of adverse event in presence of aspiration [name] & [name] 2021): Factors that may increase risk of adverse event in presence of aspiration [name] & [name] 2021): Reduced saliva; Weak cough Recommendations/Plan: Swallowing Evaluation Recommendations Swallowing Evaluation Recommendations Recommendations: PO diet PO Diet Recommendation: Dysphagia 3 (Mechanical soft); Thin liquids (Level 0) (encouraged him to start with very soft foods/liquids and advance from there, but would avoid foods like meats that gave him trouble at baseline) Liquid Administration via: Cup; No straw Medication Administration: Whole meds with liquid (may want to crush larger pills) Supervision: Patient able  to self-feed; Intermittent supervision/cueing for swallowing strategies Swallowing strategies  : Slow rate; Small bites/sips (one sip at a time; cough out food if it feels stuck) Postural changes: Stay upright 30-60 min after meals; Position pt fully upright for meals Oral care recommendations: Oral care BID (2x/day) Treatment Plan Treatment Plan Treatment recommendations: Therapy as outlined in treatment plan below Follow-up recommendations: Home health SLP Functional status assessment: Patient has had a recent decline in their functional status and demonstrates the ability to make significant improvements in function in a reasonable and predictable amount of time. Treatment frequency: Min 2x/week Treatment duration: 2 weeks Interventions: Aspiration precaution training; Compensatory techniques; Patient/family education; Trials of upgraded texture/liquids; Diet toleration management by SLP Recommendations Recommendations for follow up therapy are one component of a multi-disciplinary discharge planning process, led by the attending physician.  Recommendations may be updated based on patient status, additional functional criteria and insurance authorization. Assessment: Orofacial Exam: Orofacial Exam Oral Cavity: Oral Hygiene: WFL Oral Cavity - Dentition: Adequate natural dentition Orofacial Anatomy: WFL Oral Motor/Sensory Function: WFL Anatomy: Anatomy: Presence of cervical hardware Boluses Administered: Boluses Administered Boluses Administered: Thin liquids (Level 0); Mildly thick liquids (Level 2, nectar thick); Moderately thick liquids (Level 3, honey thick); Puree;  Solid  Oral Impairment Domain: Oral Impairment Domain Lip Closure: No labial escape Tongue control during bolus hold: Posterior escape of greater than half of bolus (x1) Bolus preparation/mastication: Timely and efficient chewing and mashing Bolus transport/lingual motion: Brisk tongue motion Oral residue: Residue collection on oral structures  Location of oral residue : Tongue Initiation of pharyngeal swallow : Posterior laryngeal surface of the epiglottis  Pharyngeal Impairment Domain: Pharyngeal Impairment Domain Soft palate elevation: No bolus between soft palate (SP)/pharyngeal wall (PW) Laryngeal elevation: Partial superior movement of thyroid cartilage/partial approximation of arytenoids to epiglottic petiole Anterior hyoid excursion: Partial anterior movement Epiglottic movement: Partial inversion Laryngeal vestibule closure: Incomplete, narrow column air/contrast in laryngeal vestibule Pharyngeal stripping wave : Present - diminished Pharyngeal contraction (A/P view only): N/A Pharyngoesophageal segment opening: Partial distention/partial duration, partial obstruction of flow Tongue base retraction: Wide column of contrast or air between tongue base and PPW Pharyngeal residue: Majority of contrast within or on pharyngeal structures Location of pharyngeal residue: Valleculae; Pyriform sinuses  Esophageal Impairment Domain: Esophageal Impairment Domain Esophageal clearance upright position: Complete clearance, esophageal coating Pill: Esophageal Impairment Domain Esophageal clearance upright position: Complete clearance, esophageal coating Penetration/Aspiration Scale Score: Penetration/Aspiration Scale Score 1.  Material does not enter airway: Solid; Puree; Moderately thick liquids (Level 3, honey thick); Mildly thick liquids (Level 2, nectar thick) 8.  Material enters airway, passes BELOW cords without attempt by patient to eject out (silent aspiration) : Thin liquids (Level 0) Compensatory Strategies: Compensatory Strategies Compensatory strategies: Yes Straw: Ineffective Ineffective Straw: Thin liquid (Level 0)   General Information: Caregiver present: Yes (wife)  Diet Prior to this Study: NPO   Temperature : Normal   Respiratory Status: WFL   Supplemental O2: None (Room air)   History of Recent Intubation: No  Behavior/Cognition: Alert;  Cooperative; Pleasant mood Self-Feeding Abilities: Able to self-feed Baseline vocal quality/speech: Normal Volitional Cough: Able to elicit Volitional Swallow: Able to elicit Exam Limitations: No limitations Goal Planning: Prognosis for improved oropharyngeal function: Good Barriers to Reach Goals: Time post onset No data recorded Patient/Family Stated Goal: wants to get out of hospital to spend time with his grandchildren Consulted and agree with results and recommendations: Patient; Family member/caregiver Pain: Pain Assessment Pain Assessment: Faces Pain Score: -- (no number provided) Faces Pain Scale: 2 Pain Location: neck Pain Descriptors / Indicators: Sore; Operative site guarding Pain Intervention(s): Limited activity within patient's tolerance; Monitored during session; Repositioned End of Session: Start Time:SLP Start Time (ACUTE ONLY): 1326 Stop Time: SLP Stop Time (ACUTE ONLY): 1352 Time Calculation:SLP Time Calculation (min) (ACUTE ONLY): 26 min Charges: SLP Evaluations $ SLP Speech Visit: 1 Visit SLP Evaluations $BSS Swallow: 1 Procedure $MBS Swallow: 1 Procedure $Swallowing Treatment: 1 Procedure SLP visit diagnosis: SLP Visit Diagnosis: Dysphagia, pharyngeal phase (R13.13) Past Medical History: Past Medical History: Diagnosis Date  Arthritis   OA- "everywhere"   Cancer   tonsil  Chronic kidney disease   passed renal calculi  h/o , passed spontaneously  Depression   Peripheral vascular disease   Pulmonary embolism 06/22/2012  all care has been in [facility], treated first /w Heparin , then Coumadin & now Xarelto   Past Surgical History: Past Surgical History: Procedure Laterality Date  ANTERIOR CERVICAL DECOMPRESSION/DISCECTOMY FUSION 4 LEVELS N/A 07/20/2022  Procedure: ANTERIOR CERVICAL DECOMPRESSION/DISCECTOMY FUSION 4 LEVELS C3-C7;  Surgeon: Venita Lick, MD;  Location: [facility] OR;  Service: Orthopedics;  Laterality: N/A;  4.5 hrs 3 C-Bed  BACK SURGERY  2008  lumbar  CERVICAL LAMINECTOMY  2002  HIP  RESECTION ARTHROPLASTY Right 2000  PORTACATH PLACEMENT Bilateral 2014  SHOULDER ARTHROSCOPY W/ ROTATOR CUFF REPAIR Right 2010  TOTAL HIP ARTHROPLASTY Left 10/17/2013  Procedure: LEFT TOTAL HIP ARTHROPLASTY ANTERIOR APPROACH;  Surgeon: Loanne DrillingFrank Aluisio V, MD;  Location: [facility] OR;  Service: Orthopedics;  Laterality: Left; Mahala MenghiniLaura N., M.A. CCC-SLP Acute Rehabilitation Services Office [phone] Secure chat preferred 08/01/2022, 2:44 PM  CT Soft Tissue Neck W Contrast  Result Date: 07/31/2022 CLINICAL DATA:  Dysphagia EXAM: CT NECK WITH CONTRAST TECHNIQUE: Multidetector CT imaging of the neck was performed using the standard protocol following the bolus administration of intravenous contrast. RADIATION DOSE REDUCTION: This exam was performed according to the departmental dose-optimization program which includes automated exposure control, adjustment of the mA and/or kV according to patient size and/or use of iterative reconstruction technique. CONTRAST:  75mL OMNIPAQUE IOHEXOL 350 MG/ML SOLN COMPARISON:  Cervical spine CT 07/27/2022 FINDINGS: Pharynx and larynx: The nasal cavity and nasopharynx are unremarkable. There is mucosal edema along the lower aspect of the posterior oropharyngeal/hypopharyngeal wall (3-81). The oral cavity and oropharynx are otherwise unremarkable. The parapharyngeal spaces are clear. The vocal folds are normal in appearance.  The epiglottis is normal. The patient is status post recent C3 through C7 ACDF. There is retropharyngeal fluid measuring up to 9 mm in thickness at the C2 level. This is contiguous with a fluid collection in the left neck along the left aspect of the laryngeal cartilage. There is peripheral enhancement around this collection as well as small locules of gas. The collection measures up to approximately 3.9 cm AP x 1.9 cm TV x 6.1 cm CC at the level of the cricoid cartilage. The retropharyngeal portion of the fluid collection extends from the C2 through C7 levels measuring up to  approximately 8.0 cm. The collection extends posterior to the esophagus without mass effect. The collection is overall grossly similar in size to the prior CT cervical spine but with slightly decreased gas. Salivary glands: The parotid and submandibular glands are unremarkable. Thyroid: Unremarkable. Lymph nodes: There is no pathologic lymphadenopathy in the neck. Vascular: There is calcified plaque of the carotid bifurcations bilaterally with high-grade stenosis of the proximal right internal carotid artery. There is also probable hemodynamically significant stenosis on the left. Limited intracranial: The imaged intracranial compartment is unremarkable. Visualized orbits: The globes and orbits are unremarkable. Mastoids and visualized paranasal sinuses: Clear. Skeleton: Postsurgical changes reflecting recent C3 through C7 ACDF are noted. There is no evidence of hardware related complication. There are calcified loose bodies anterior to the right scapula. Upper chest: The imaged lung apices are clear. Other: None. IMPRESSION: 1. Postsurgical changes reflecting recent C3 through C7 ACDF with a peripherally enhancing collection in the left neck extending to the retropharynx with small internal locules of gas and mucosal edema along the adjacent posterior oropharynx/hypopharynx. The collection is grossly similar in size compared to the recent CT cervical spine and again may reflect a postoperative seroma; however, given peripheral enhancement, abscess is not excluded. 2. Calcified plaque of the carotid bifurcations bilaterally with high-grade stenosis on the right and probable hemodynamically significant stenosis on the left. Consider nonemergent CTA or carotid doppler for better evaluation. Electronically Signed   By: Lesia HausenPeter  Noone M.D.   On: 07/31/2022 11:15   DG Chest 2 View  Result Date: 07/31/2022 CLINICAL DATA:  Cough and shortness of breath. EXAM: CHEST - 2 VIEW COMPARISON:  07/27/2022 FINDINGS: Heart size and  mediastinal contours are unremarkable. Asymmetric elevation of the left hemidiaphragm, unchanged.  Similar appearance of left-sided pleural thickening. No airspace disease, interstitial edema or pneumothorax. IMPRESSION: 1. No active cardiopulmonary abnormalities. 2. Stable left-sided pleural thickening. Electronically Signed   By: Signa Kell M.D.   On: 07/31/2022 09:03   CT CERVICAL SPINE WO CONTRAST  Result Date: 07/27/2022 CLINICAL DATA:  Spine surgery/procedure, postop, infection suspected. Surgery 1 week ago EXAM: CT CERVICAL SPINE WITHOUT CONTRAST TECHNIQUE: Multidetector CT imaging of the cervical spine was performed without intravenous contrast. Multiplanar CT image reconstructions were also generated. RADIATION DOSE REDUCTION: This exam was performed according to the departmental dose-optimization program which includes automated exposure control, adjustment of the mA and/or kV according to patient size and/or use of iterative reconstruction technique. COMPARISON:  None Available. FINDINGS: Alignment: Facet joints are aligned without dislocation or traumatic listhesis. Dens and lateral masses are aligned. Skull base and vertebrae: Postsurgical changes from recent C3-C7 ACDF with intact hardware. Hardware is well seated. No perihardware lucency or fracture. Remaining osseous structures within normal limits without fracture or pathologic bone process. Soft tissues and spinal canal: Fluid and air collection in the prevertebral soft tissues tracking the left of the trachea and thyroid gland along the surgical incision site. Collection measures approximately 3.6 x 1.8 cm transaxially and extends approximately 6.5 cm in craniocaudal length from the C3 through T1 levels. No visible canal hematoma, although canal is largely obscured by artifact from hardware. Disc levels: Mild disc height loss at C7-T1. Degenerative facet arthropathy of multiple levels, most pronounced at C7-T1. Upper chest: Negative. Other:  Bilateral carotid atherosclerosis. IMPRESSION: 1. Postsurgical changes from recent C3-C7 ACDF with intact hardware. 2. Fluid and air collection in the prevertebral soft tissues and left neck along the surgical incision site. Collection measures approximately 3.6 x 1.8 cm transaxially and extends approximately 6.5 cm in craniocaudal length from the C3 through T1 levels. Findings may represent postoperative seroma or hematoma. An infected fluid collection would be difficult to exclude by imaging alone. Electronically Signed   By: Duanne Guess D.O.   On: 07/27/2022 10:45   CT Angio Chest PE W and/or Wo Contrast  Addendum Date: 07/27/2022   ADDENDUM REPORT: 07/27/2022 10:28 ADDENDUM: This addendum is given for the purpose of noting the patient ascending thoracic aorta measures 4.3 cm. Recommend annual imaging followup by CTA or MRA. This recommendation follows 2010 ACCF/AHA/AATS/ACR/ASA/SCA/SCAI/SIR/STS/SVM Guidelines for the Diagnosis and Management of Patients with Thoracic Aortic Disease. Circulation. 2010; 121: Z610-R604. Aortic aneurysm NOS (ICD10-I71.9) Electronically Signed   By: Drusilla Kanner M.D.   On: 07/27/2022 10:28   Result Date: 07/27/2022 CLINICAL DATA:  Patient status post cervical spine surgery 07/20/2022. Onset difficulty breathing with neck, chest and shoulder pain 2 days ago. EXAM: CT ANGIOGRAPHY CHEST WITH CONTRAST TECHNIQUE: Multidetector CT imaging of the chest was performed using the standard protocol during bolus administration of intravenous contrast. Multiplanar CT image reconstructions and MIPs were obtained to evaluate the vascular anatomy. RADIATION DOSE REDUCTION: This exam was performed according to the departmental dose-optimization program which includes automated exposure control, adjustment of the mA and/or kV according to patient size and/or use of iterative reconstruction technique. CONTRAST:  75 mL OMNIPAQUE IOHEXOL 350 MG/ML SOLN COMPARISON:  Single-view of the chest  07/27/2022. CT chest 03/23/2004. FINDINGS: Cardiovascular: Pulmonary emboli are identified, best seen in branches to the lingula as on image 132 of series 5. There is no evidence of right heart strain. Calcific aortic and coronary atherosclerosis is present. The ascending thoracic aorta measures 4.3 cm in diameter. Heart size is  normal. No pericardial effusion. Mediastinum/Nodes: No enlarged mediastinal, hilar, or axillary lymph nodes. Thyroid gland, trachea, and esophagus demonstrate no significant findings. Lungs/Pleura: No pleural effusion. Dependent airspace disease in the left lower lobe has an appearance most compatible with atelectasis. Upper Abdomen: A heterogeneous mass lesion in the right hepatic lobe measuring 4.0 cm is identified and there is a second similar lesion in the left hepatic lobe measuring 4.7 cm. These are present on the 2005 abdomen and pelvis CT where they measured 5 4 and 5.8 cm respectively. Visualized upper abdomen is otherwise unremarkable. Musculoskeletal: No acute or focal abnormality. Mid and lower thoracic spondylosis noted. Severe bilateral glenohumeral osteoarthritis is present. Review of the MIP images confirms the above findings. IMPRESSION: 1. The exam is positive for pulmonary emboli. No evidence of right heart strain. 2. Aortic atherosclerosis. 3. A heterogeneous mass lesion in the right hepatic lobe measuring 4.0 cm is identified and there is a second similar lesion left hepatic lobe measuring 4.7 cm. These are present on the 2005 CT scan and measures slightly smaller in size consistent with benign entities. 4. Calcific coronary artery disease. These results were called by telephone at the time of interpretation on 07/27/2022 at 10:16 am to provider DAN FLOYD , who verbally acknowledged these results. Aortic Atherosclerosis (ICD10-I70.0). Electronically Signed: By: Drusilla Kanner M.D. On: 07/27/2022 10:17   DG Chest Port 1 View  Result Date: 07/27/2022 CLINICAL DATA:   Chest pain EXAM: PORTABLE CHEST 1 VIEW COMPARISON:  Radiograph 03/16/2022, radiograph 10/07/2013, CT 03/23/2004 FINDINGS: Unchanged cardiomediastinal silhouette. There is unchanged left-sided pleural thickening and elevated left hemidiaphragm, as seen on multiple prior exams. There is no focal airspace consolidation. No large effusion or evidence of pneumothorax. Thoracic spondylosis. Bilateral shoulder degenerative changes, severe on the right with multiple ossified joint bodies noted. IMPRESSION: Unchanged left-sided pleural thickening and elevated left hemidiaphragm, as seen on multiple prior exams. No evidence of acute cardiopulmonary disease. Electronically Signed   By: Caprice Renshaw M.D.   On: 07/27/2022 08:31   DG Cervical Spine 2 or 3 views  Result Date: 07/20/2022 CLINICAL DATA:  ACDF C3-C7 EXAM: CERVICAL SPINE - 2-3 VIEW COMPARISON:  None Available. FINDINGS: Four fluoroscopic images are obtained during the performance of the procedure and are provided for interpretation only. Images demonstrate ACDF of C3-C7, with interbody disc spacers at each level. Fluoroscopy time: 96.5 seconds Cumulative air kerma: 14.13 mGy IMPRESSION: Four fluoroscopic images are obtained during the performance of the procedure and are provided for interpretation only. Electronically Signed   By: Wiliam Ke M.D.   On: 07/20/2022 17:15   DG C-Arm 1-60 Min-No Report  Result Date: 07/20/2022 Fluoroscopy was utilized by the requesting physician.  No radiographic interpretation.   DG C-Arm 1-60 Min-No Report  Result Date: 07/20/2022 Fluoroscopy was utilized by the requesting physician.  No radiographic interpretation.   DG C-Arm 1-60 Min-No Report  Result Date: 07/20/2022 Fluoroscopy was utilized by the requesting physician.  No radiographic interpretation.   DG C-Arm 1-60 Min-No Report  Result Date: 07/20/2022 Fluoroscopy was utilized by the requesting physician.  No radiographic interpretation.   DG C-Arm 1-60  Min-No Report  Result Date: 07/20/2022 Fluoroscopy was utilized by the requesting physician.  No radiographic interpretation.   DG C-Arm 1-60 Min-No Report  Result Date: 07/20/2022 Fluoroscopy was utilized by the requesting physician.  No radiographic interpretation.        Discharge Plan:  discharge to home  Disposition: Anrew is a pleasant 67 yr old  man who presented 10 days after a multilevel cervical fusion with dysphagia.  Three days prior he was diagnosed with a PE and started on xarelto.  However, because of the dysphagia he was no longer able to take meds and so was admitted for further treatment.  Over the course of the admission he has received IV decadron and has noted significant improvement in the dysphagia.  SLP evaluation noted improvement and cleared him  for po diet with recommendations  Patients wound is healing well without erythema or swelling.  Sutures removed and new steri strips applied.  I will electronically send in percocet and robaxin.  He will continue xarelto as recommended by pharmacy.   He understands dose increased to 15mg  bid for acute PE,and on 4/29 dose to change to 20mg  daily, He confirms he has both strengths at home.     Follow up set up with me for 4 weeks   Signed: Alvy Beal for Dr. Venita Lick [facility] [phone] 08/03/2022, 12:23 PM

## 2022-08-03 NOTE — Care Management Important Message (Signed)
Important Message  Patient Details  Name: Spencer Cunningham MRN: [id number] Date of Birth: Sep 04, 1955   Medicare Important Message Given:  Yes     Sherilyn Banker 08/03/2022, 1:58 PM

## 2023-01-03 ENCOUNTER — Other Ambulatory Visit: Payer: Self-pay | Admitting: Urology

## 2023-01-04 ENCOUNTER — Other Ambulatory Visit: Payer: Self-pay | Admitting: Urology

## 2023-01-04 NOTE — Progress Notes (Addendum)
COVID Vaccine received:  []  No [x]  Yes Date of any COVID positive Test in last 90 days: No PCP - Joycelyn Rua MD Cardiologist - no  Chest x-ray -07/31/22 Epic  EKG -  07/31/22 Epic Stress Test - no ECHO - no Cardiac Cath - no  Bowel Prep - [x]  No  []   Yes ______  Pacemaker / ICD device [x]  No []  Yes   Spinal Cord Stimulator:[x]  No []  Yes       History of Sleep Apnea? [x]  No []  Yes   CPAP used?- [x]  No []  Yes    Does the patient monitor blood sugar?          [x]  No []  Yes  []  N/A  Patient has: [x]  NO Hx DM   []  Pre-DM                 []  DM1  []   DM2 Does patient have a [employer] or Dexacom? []  No []  Yes   Fasting Blood Sugar Ranges-  Checks Blood Sugar _____ times a day  GLP1 agonist / usual dose - no GLP1 instructions:  SGLT-2 inhibitors / usual dose - no SGLT-2 instructions:   Blood Thinner / Instructions:Xaralto -will stop 01/06/23 per MD instruction Aspirin Instructions:  Comments:   Activity level: Patient is able to climb a flight of stairs without difficulty; [x]  No CP  [x]  No SOB, but would have ___   Patient can  perform ADLs without assistance.   Anesthesia review:   Patient denies shortness of breath, fever, cough and chest pain at PAT appointment.  Patient verbalized understanding and agreement to the Pre-Surgical Instructions that were given to them at this PAT appointment. Patient was also educated of the need to review these PAT instructions again prior to his/her surgery.I reviewed the appropriate phone numbers to call if they have any and questions or concerns.

## 2023-01-04 NOTE — Patient Instructions (Addendum)
SURGICAL WAITING ROOM VISITATION  Patients having surgery or a procedure may have no more than 2 support people in the waiting area - these visitors may rotate.    Children under the age of 53 must have an adult with them who is not the patient.  Due to an increase in RSV and influenza rates and associated hospitalizations, children ages 26 and under may not visit patients in [facility] hospitals.  If the patient needs to stay at the hospital during part of their recovery, the visitor guidelines for inpatient rooms apply. Pre-op nurse will coordinate an appropriate time for 1 support person to accompany patient in pre-op.  This support person may not rotate.    Please refer to the [facility] website for the visitor guidelines for Inpatients (after your surgery is over and you are in a regular room).       Your procedure is scheduled on: 01/09/23   Report to [facility] Main Entrance    Report to admitting at 5:15 AM   Call this number if you have problems the morning of surgery [phone]   Do not eat food or drink liquids:After Midnight.        Oral Hygiene is also important to reduce your risk of infection.                                    Remember - BRUSH YOUR TEETH THE MORNING OF SURGERY WITH YOUR REGULAR TOOTHPASTE   Stop all vitamins and herbal supplements 7 days before surgery.   Take these medicines the morning of surgery with A SIP OF WATER: Atorvastatin, Cymbalta             You may not have any metal on your body including hair pins, jewelry, and body piercing             Do not wear make-up, lotions, powders, cologne, or deodorant              Men may shave face and neck.   Do not bring valuables to the hospital. [place] IS NOT             RESPONSIBLE   FOR VALUABLES.   Contacts, glasses, dentures or bridgework may not be worn into surgery.   Bring small overnight bag day of surgery.   DO NOT BRING YOUR HOME MEDICATIONS TO THE  HOSPITAL. PHARMACY WILL DISPENSE MEDICATIONS LISTED ON YOUR MEDICATION LIST TO YOU DURING YOUR ADMISSION IN THE HOSPITAL!    Patients discharged on the day of surgery will not be allowed to drive home.  Someone NEEDS to stay with you for the first 24 hours after anesthesia.   Special Instructions: Bring a copy of your healthcare power of attorney and living will documents the day of surgery if you haven't scanned them before.              Please read over the following fact sheets you were given: IF YOU HAVE QUESTIONS ABOUT YOUR PRE-OP INSTRUCTIONS PLEASE CALL [phone] Rosey Bath   If you received a COVID test during your pre-op visit  it is requested that you wear a mask when out in public, stay away from anyone that may not be feeling well and notify your surgeon if you develop symptoms. If you test positive for Covid or have been in contact with anyone that has tested positive in the last 10  days please notify you surgeon.    [place] - Preparing for Surgery Before surgery, you can play an important role.  Because skin is not sterile, your skin needs to be as free of germs as possible.  You can reduce the number of germs on your skin by washing with CHG (chlorahexidine gluconate) soap before surgery.  CHG is an antiseptic cleaner which kills germs and bonds with the skin to continue killing germs even after washing. Please DO NOT use if you have an allergy to CHG or antibacterial soaps.  If your skin becomes reddened/irritated stop using the CHG and inform your nurse when you arrive at Short Stay. Do not shave (including legs and underarms) for at least 48 hours prior to the first CHG shower.  You may shave your face/neck.  Please follow these instructions carefully:  1.  Shower with CHG Soap the night before surgery and the  morning of surgery.  2.  If you choose to wash your hair, wash your hair first as usual with your normal  shampoo.  3.  After you shampoo, rinse your hair and body  thoroughly to remove the shampoo.                             4.  Use CHG as you would any other liquid soap.  You can apply chg directly to the skin and wash.  Gently with a scrungie or clean washcloth.  5.  Apply the CHG Soap to your body ONLY FROM THE NECK DOWN.   Do   not use on face/ open                           Wound or open sores. Avoid contact with eyes, ears mouth and   genitals (private parts).                       Wash face,  Genitals (private parts) with your normal soap.             6.  Wash thoroughly, paying special attention to the area where your    surgery  will be performed.  7.  Thoroughly rinse your body with warm water from the neck down.  8.  DO NOT shower/wash with your normal soap after using and rinsing off the CHG Soap.                9.  Pat yourself dry with a clean towel.            10.  Wear clean pajamas.            11.  Place clean sheets on your bed the night of your first shower and do not  sleep with pets. Day of Surgery : Do not apply any lotions/deodorants the morning of surgery.  Please wear clean clothes to the hospital/surgery center.  FAILURE TO FOLLOW THESE INSTRUCTIONS MAY RESULT IN THE CANCELLATION OF YOUR SURGERY  PATIENT SIGNATURE_________________________________  NURSE SIGNATURE__________________________________  ________________________________________________________________________

## 2023-01-05 ENCOUNTER — Other Ambulatory Visit: Payer: Self-pay

## 2023-01-05 ENCOUNTER — Encounter (HOSPITAL_COMMUNITY): Payer: Self-pay

## 2023-01-05 ENCOUNTER — Encounter (HOSPITAL_COMMUNITY)
Admission: RE | Admit: 2023-01-05 | Discharge: 2023-01-05 | Disposition: A | Discharge status: Home / Self Care | Payer: Medicare Other | Source: Ambulatory Visit | Attending: Urology

## 2023-01-05 DIAGNOSIS — Z01818 Encounter for other preprocedural examination: Secondary | ICD-10-CM | POA: Diagnosis present

## 2023-01-05 DIAGNOSIS — Z01812 Encounter for preprocedural laboratory examination: Secondary | ICD-10-CM | POA: Diagnosis not present

## 2023-01-05 HISTORY — DX: Personal history of urinary calculi: Z87.442

## 2023-01-05 LAB — CBC
HCT: 46.7 % (ref 39.0–52.0)
Hemoglobin: 15.5 g/dL (ref 13.0–17.0)
MCH: 32.6 pg (ref 26.0–34.0)
MCHC: 33.2 g/dL (ref 30.0–36.0)
MCV: 98.1 fL (ref 80.0–100.0)
Platelets: 208 10*3/uL (ref 150–400)
RBC: 4.76 MIL/uL (ref 4.22–5.81)
RDW: 13.5 % (ref 11.5–15.5)
WBC: 5.3 10*3/uL (ref 4.0–10.5)
nRBC: 0 % (ref 0.0–0.2)

## 2023-01-05 LAB — BASIC METABOLIC PANEL
Anion gap: 8 (ref 5–15)
BUN: 17 mg/dL (ref 8–23)
CO2: 26 mmol/L (ref 22–32)
Calcium: 8.7 mg/dL — ABNORMAL LOW (ref 8.9–10.3)
Chloride: 103 mmol/L (ref 98–111)
Creatinine, Ser: 0.76 mg/dL (ref 0.61–1.24)
GFR, Estimated: >60 mL/min (ref >60)
Glucose, Bld: 99 mg/dL (ref 70–99)
Potassium: 4.8 mmol/L (ref 3.5–5.1)
Sodium: 137 mmol/L (ref 135–145)

## 2023-01-08 NOTE — Anesthesia Preprocedure Evaluation (Signed)
Anesthesia Evaluation  Patient identified by MRN, date of birth, ID band Patient awake    Reviewed: Allergy & Precautions, NPO status , Patient's Chart, lab work & pertinent test results  Airway Mallampati: IV  TM Distance: >3 FB Neck ROM: Limited  Mouth opening: Limited Mouth Opening  Dental  (+) Teeth Intact, Dental Advisory Given   Pulmonary former smoker, PE (2014, xarelto LD: 9/14)   Pulmonary exam normal breath sounds clear to auscultation       Cardiovascular negative cardio ROS Normal cardiovascular exam Rhythm:Regular Rate:Normal     Neuro/Psych  PSYCHIATRIC DISORDERS  Depression       GI/Hepatic negative GI ROS, Neg liver ROS,,,  Endo/Other  negative endocrine ROS    Renal/GU negative Renal ROS  negative genitourinary   Musculoskeletal  (+) Arthritis , Osteoarthritis,    Abdominal   Peds  Hematology negative hematology ROS (+)   Anesthesia Other Findings   Reproductive/Obstetrics negative OB ROS                             Anesthesia Physical Anesthesia Plan  ASA: 2  Anesthesia Plan: General   Post-op Pain Management: Tylenol PO (pre-op)*   Induction: Intravenous  PONV Risk Score and Plan: 2 and Ondansetron, Dexamethasone and Treatment may vary due to age or medical condition  Airway Management Planned: LMA  Additional Equipment: None  Intra-op Plan:   Post-operative Plan: Extubation in OR  Informed Consent: I have reviewed the patients History and Physical, chart, labs and discussed the procedure including the risks, benefits and alternatives for the proposed anesthesia with the patient or authorized representative who has indicated his/her understanding and acceptance.     Dental advisory given  Plan Discussed with: CRNA  Anesthesia Plan Comments:        Anesthesia Quick Evaluation

## 2023-01-08 NOTE — H&P (Addendum)
Office Visit Report     01/01/2023   --------------------------------------------------------------------------------   Consepcion Hearing. Waibel  MRN: [id number]  DOB: 11-03-1955, 67 year old Male  SSN:    PRIMARY CARE:  Alanson Aly. Len[facility]), MD  PRIMARY CARE FAX:  [phone]  REFERRING:  Si Raider. Liliane Shi, MD  PROVIDER:  Rhoderick Moody, M.D.  LOCATION:  [facility] [phone]     --------------------------------------------------------------------------------   CC/HPI: CC: Hematuria   HPI: Mr. Batdorf is a 67 year old male referred by Tera Helper, PA for a hematuria evaluation.   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 He has occasional urgency/frequency, but is not bothered by it. Nocturia x 3-4.   01/01/2023: The patient is here today for a routine follow-up. CTU from 12/19/2022 revealed a large stellate bladder stone measuring approximately 2 cm in greatest dimension. Transrectal ultrasound today shows a prostate volume of approximately 70 cm (including his median lobe). He denies interval episodes of hematuria, but continues to struggle with urinary urgency and nocturia x 3-4. He denies interval UTIs or dysuria. UA today is clear.     ALLERGIES: No Allergies    MEDICATIONS: Simvastatin 40 mg tablet  Duloxetine Hcl 60 mg capsule,delayed release   Xarelto 20 mg tablet     GU PSH: Locm 300-399Mg /Ml Iodine,1Ml - 12/12/2022       PSH Notes: hip replacement, spine surgery   NON-GU PSH: No Non-GU PSH    GU PMH: Gross hematuria - 12/12/2022, - 12/01/2022      PMH Notes: LE DVT, throat cancer   NON-GU PMH: Arthritis Cardiac murmur, unspecified DVT, History Hypercholesterolemia    FAMILY HISTORY: 3 Son's - Son prostate cancer in father - Father   SOCIAL HISTORY: Marital Status: Married Preferred Language: English; Ethnicity: Not Hispanic Or Latino; Race: White Current Smoking Status: Patient does not smoke anymore.   Tobacco Use Assessment Completed: Used Tobacco in last 30 days? Has never drank.  Drinks 2 caffeinated drinks per day.    REVIEW OF SYSTEMS:    GU Review Male:   Patient denies frequent urination, hard to postpone urination, burning/ pain with urination, get up at night to urinate, leakage of urine, stream starts and stops, trouble starting your stream, have to strain to urinate , erection problems, and penile pain.  Gastrointestinal (Upper):   Patient denies nausea, vomiting, and indigestion/ heartburn.  Gastrointestinal (Lower):   Patient denies diarrhea and constipation.  Constitutional:   Patient denies fever, night sweats, weight loss, and fatigue.  Skin:   Patient denies skin rash/ lesion and itching.  Eyes:   Patient denies blurred vision and double vision.  Ears/ Nose/ Throat:   Patient denies sore throat and sinus problems.  Hematologic/Lymphatic:   Patient denies swollen glands and easy bruising.  Cardiovascular:   Patient denies leg swelling and chest pains.  Respiratory:   Patient denies cough and shortness of breath.  Endocrine:  Patient denies excessive thirst.  Musculoskeletal:   Patient denies back pain and joint pain.  Neurological:   Patient denies headaches and dizziness.  Psychologic:   Patient denies depression and anxiety.   VITAL SIGNS: None   PHYSICAL EXAM:   Neuro:  alert and  oriented x3 Pulm:  Normal chest rise   Complexity of Data:   11/23/22  PSA  Total PSA 2.26 ng/ml    PROCEDURES:         Prostate Ultrasound - 44010  Measurement #1: Length:5.49cm Height:4.93cm Width:4.99cm Volume:70.7ml  Measurement #2: Length: 5.45cm Height: 3.89cm Width: 4.94cm Volume: 54.41ml Hard to Visualize Mid Prostate due to Shadowing-Refer to [place] 1.14cm Cystic Area SV to [employer] Several Subcentimeter Cystic Area seen throughout Prostate  Patient confirmed No Neulasta OnPro Device.        The transrectal ultrasound probe is introduced into the rectum, and the prostate is visualized. Ultrasonography is utilized throughout the procedure. At the conclusion of the procedure, the ultrasound probe is removed. The patient tolerates the procedure without complication.  No hypoechoic lesions noted. Prostate volume is approximately 70 cm when including the median lobe.         Visit Complexity - G2211          Urinalysis Dipstick Dipstick Cont'd  Color: Yellow Bilirubin: Neg mg/dL  Appearance: Clear Ketones: Neg mg/dL  Specific Gravity: 2.725 Blood: Neg ery/uL  pH: 7.0 Protein: Neg mg/dL  Glucose: Neg mg/dL Urobilinogen: 0.2 mg/dL    Nitrites: Neg    Leukocyte Esterase: Neg leu/uL    ASSESSMENT:      ICD-10 Details  1 GU:   Bladder Stone - N21.0 Undiagnosed New Problem  2   Gross hematuria - R31.0 Self-Limited  3   BPH w/LUTS - N40.1   4   Nocturia - R35.1      PLAN:           Schedule Return Visit/Planned Activity: Next Available Appointment - Schedule Surgery          Document Letter(s):  Created for Stephen C. Lenise [facility]), MD   Created for Patient: Clinical Summary         Notes:   -The risks, benefits and alternatives of cystoscopy with TURP with cystolithalopaxy was discussed with the patient. The risks included, but are not limited to, bleeding, urinary tract infection, bladder perforation requiring prolonged catheterization  and/or open bladder repair, ureteral injury, ureteral obstruction, urethral stricture disease, new or worsening voiding dysfunction, retrograde ejaculation, MI, CVA, PE, DVT and the inherent risks of general anesthesia. We also discussed the need for Foley catheterization for at least 3 days post-op and the likely need for post-op observation in the hospital following the procedure. The patient voices understanding and wishes to proceed.

## 2023-01-09 ENCOUNTER — Observation Stay (HOSPITAL_COMMUNITY)
Admission: RE | Admit: 2023-01-09 | Discharge: 2023-01-10 | Disposition: A | Discharge status: Home / Self Care | Payer: Medicare Other | Source: Ambulatory Visit | Attending: Urology | Admitting: Urology

## 2023-01-09 ENCOUNTER — Ambulatory Visit (HOSPITAL_BASED_OUTPATIENT_CLINIC_OR_DEPARTMENT_OTHER): Payer: Medicare Other | Admitting: Certified Registered"

## 2023-01-09 ENCOUNTER — Ambulatory Visit (HOSPITAL_COMMUNITY): Payer: Medicare Other | Admitting: Certified Registered"

## 2023-01-09 ENCOUNTER — Encounter (HOSPITAL_COMMUNITY)
Admission: RE | Disposition: A | Discharge status: Home / Self Care | Payer: Self-pay | Source: Ambulatory Visit | Attending: Urology

## 2023-01-09 ENCOUNTER — Encounter (HOSPITAL_COMMUNITY): Payer: Self-pay | Admitting: Urology

## 2023-01-09 ENCOUNTER — Other Ambulatory Visit: Payer: Self-pay

## 2023-01-09 DIAGNOSIS — N189 Chronic kidney disease, unspecified: Secondary | ICD-10-CM | POA: Diagnosis not present

## 2023-01-09 DIAGNOSIS — Z86718 Personal history of other venous thrombosis and embolism: Secondary | ICD-10-CM | POA: Insufficient documentation

## 2023-01-09 DIAGNOSIS — N210 Calculus in bladder: Secondary | ICD-10-CM

## 2023-01-09 DIAGNOSIS — Z96649 Presence of unspecified artificial hip joint: Secondary | ICD-10-CM | POA: Insufficient documentation

## 2023-01-09 DIAGNOSIS — N138 Other obstructive and reflux uropathy: Principal | ICD-10-CM | POA: Diagnosis present

## 2023-01-09 DIAGNOSIS — Z7901 Long term (current) use of anticoagulants: Secondary | ICD-10-CM | POA: Diagnosis not present

## 2023-01-09 DIAGNOSIS — R319 Hematuria, unspecified: Secondary | ICD-10-CM | POA: Insufficient documentation

## 2023-01-09 DIAGNOSIS — N401 Enlarged prostate with lower urinary tract symptoms: Secondary | ICD-10-CM

## 2023-01-09 DIAGNOSIS — Z8521 Personal history of malignant neoplasm of larynx: Secondary | ICD-10-CM | POA: Insufficient documentation

## 2023-01-09 DIAGNOSIS — Z79899 Other long term (current) drug therapy: Secondary | ICD-10-CM | POA: Insufficient documentation

## 2023-01-09 DIAGNOSIS — Z85828 Personal history of other malignant neoplasm of skin: Secondary | ICD-10-CM | POA: Insufficient documentation

## 2023-01-09 DIAGNOSIS — Z87891 Personal history of nicotine dependence: Secondary | ICD-10-CM | POA: Insufficient documentation

## 2023-01-09 DIAGNOSIS — R351 Nocturia: Secondary | ICD-10-CM | POA: Insufficient documentation

## 2023-01-09 HISTORY — PX: TRANSURETHRAL RESECTION OF PROSTATE: SHX73

## 2023-01-09 HISTORY — PX: CYSTOSCOPY WITH LITHOLAPAXY: SHX1425

## 2023-01-09 SURGERY — CYSTOSCOPY, WITH BLADDER CALCULUS LITHOLAPAXY
Anesthesia: General | Site: Prostate

## 2023-01-09 MED ORDER — ZOLPIDEM TARTRATE 5 MG PO TABS: 5.0000 mg | ORAL_TABLET | Freq: Every evening | ORAL | Status: DC | PRN

## 2023-01-09 MED ORDER — CHLORHEXIDINE GLUCONATE 0.12 % MT SOLN
15.0000 mL | Freq: Once | OROMUCOSAL | Status: AC
  Administered 2023-01-09: 15 mL via OROMUCOSAL

## 2023-01-09 MED ORDER — PHENYLEPHRINE 80 MCG/ML (10ML) SYRINGE FOR IV PUSH (FOR BLOOD PRESSURE SUPPORT)
PREFILLED_SYRINGE | INTRAVENOUS | Status: DC | PRN
  Administered 2023-01-09 (×3): 80 ug via INTRAVENOUS
  Administered 2023-01-09: 40 ug via INTRAVENOUS
  Administered 2023-01-09: 80 ug via INTRAVENOUS
  Administered 2023-01-09: 40 ug via INTRAVENOUS
  Administered 2023-01-09: 80 ug via INTRAVENOUS

## 2023-01-09 MED ORDER — PROPOFOL 10 MG/ML IV BOLUS
INTRAVENOUS | Status: AC
  Filled 2023-01-09: qty 20

## 2023-01-09 MED ORDER — DIPHENHYDRAMINE HCL 50 MG/ML IJ SOLN: 12.5000 mg | Freq: Four times a day (QID) | INTRAMUSCULAR | Status: DC | PRN

## 2023-01-09 MED ORDER — SODIUM CHLORIDE 0.9 % IR SOLN
3000.0000 mL | Status: DC
  Administered 2023-01-09: 3000 mL

## 2023-01-09 MED ORDER — AMISULPRIDE (ANTIEMETIC) 5 MG/2ML IV SOLN: 10.0000 mg | Freq: Once | INTRAVENOUS | Status: DC | PRN

## 2023-01-09 MED ORDER — SODIUM CHLORIDE 0.9 % IR SOLN
Status: DC | PRN
  Administered 2023-01-09 (×3): 6000 mL
  Administered 2023-01-09: 3000 mL
  Administered 2023-01-09 (×3): 6000 mL

## 2023-01-09 MED ORDER — MIDAZOLAM HCL 2 MG/2ML IJ SOLN
INTRAMUSCULAR | Status: AC
  Filled 2023-01-09: qty 2

## 2023-01-09 MED ORDER — ONDANSETRON HCL 4 MG/2ML IJ SOLN
INTRAMUSCULAR | Status: DC | PRN
  Administered 2023-01-09: 4 mg via INTRAVENOUS

## 2023-01-09 MED ORDER — DULOXETINE HCL 60 MG PO CPEP
60.0000 mg | ORAL_CAPSULE | Freq: Every morning | ORAL | Status: DC
  Administered 2023-01-10: 60 mg via ORAL
  Filled 2023-01-09: qty 1

## 2023-01-09 MED ORDER — OXYCODONE HCL 5 MG PO TABS: 5.0000 mg | ORAL_TABLET | Freq: Once | ORAL | Status: DC | PRN

## 2023-01-09 MED ORDER — HYDROMORPHONE HCL 1 MG/ML IJ SOLN: 0.5000 mg | INTRAMUSCULAR | Status: DC | PRN

## 2023-01-09 MED ORDER — DIPHENHYDRAMINE HCL 12.5 MG/5ML PO ELIX: 12.5000 mg | ORAL_SOLUTION | Freq: Four times a day (QID) | ORAL | Status: DC | PRN

## 2023-01-09 MED ORDER — STERILE WATER FOR IRRIGATION IR SOLN
Status: DC | PRN
  Administered 2023-01-09: 250 mL

## 2023-01-09 MED ORDER — CHLORHEXIDINE GLUCONATE CLOTH 2 % EX PADS
6.0000 | MEDICATED_PAD | Freq: Every day | CUTANEOUS | Status: DC
  Administered 2023-01-10: 6 via TOPICAL

## 2023-01-09 MED ORDER — ATORVASTATIN CALCIUM 40 MG PO TABS
40.0000 mg | ORAL_TABLET | Freq: Every morning | ORAL | Status: DC
  Administered 2023-01-10: 40 mg via ORAL
  Filled 2023-01-09: qty 1

## 2023-01-09 MED ORDER — MIDAZOLAM HCL 2 MG/2ML IJ SOLN
INTRAMUSCULAR | Status: DC | PRN
  Administered 2023-01-09: 2 mg via INTRAVENOUS

## 2023-01-09 MED ORDER — FENTANYL CITRATE (PF) 100 MCG/2ML IJ SOLN
INTRAMUSCULAR | Status: AC
  Filled 2023-01-09: qty 2

## 2023-01-09 MED ORDER — LIDOCAINE 2% (20 MG/ML) 5 ML SYRINGE
INTRAMUSCULAR | Status: DC | PRN
  Administered 2023-01-09: 60 mg via INTRAVENOUS

## 2023-01-09 MED ORDER — ACETAMINOPHEN 325 MG PO TABS: 650.0000 mg | ORAL_TABLET | ORAL | Status: DC | PRN

## 2023-01-09 MED ORDER — ONDANSETRON HCL 4 MG/2ML IJ SOLN: 4.0000 mg | Freq: Once | INTRAMUSCULAR | Status: DC | PRN

## 2023-01-09 MED ORDER — ACETAMINOPHEN 500 MG PO TABS
1000.0000 mg | ORAL_TABLET | Freq: Once | ORAL | Status: AC
  Administered 2023-01-09: 1000 mg via ORAL
  Filled 2023-01-09: qty 2

## 2023-01-09 MED ORDER — ONDANSETRON HCL 4 MG/2ML IJ SOLN: 4.0000 mg | INTRAMUSCULAR | Status: DC | PRN

## 2023-01-09 MED ORDER — SODIUM CHLORIDE 0.9 % IV SOLN: INTRAVENOUS | Status: DC

## 2023-01-09 MED ORDER — HYDROMORPHONE HCL 1 MG/ML IJ SOLN: 0.2500 mg | INTRAMUSCULAR | Status: DC | PRN

## 2023-01-09 MED ORDER — CIPROFLOXACIN IN D5W 400 MG/200ML IV SOLN
400.0000 mg | Freq: Two times a day (BID) | INTRAVENOUS | Status: DC
  Administered 2023-01-09 – 2023-01-10 (×2): 400 mg via INTRAVENOUS
  Filled 2023-01-09 (×2): qty 200

## 2023-01-09 MED ORDER — DEXAMETHASONE SODIUM PHOSPHATE 10 MG/ML IJ SOLN
INTRAMUSCULAR | Status: AC
  Filled 2023-01-09: qty 1

## 2023-01-09 MED ORDER — LACTATED RINGERS IV SOLN: INTRAVENOUS | Status: DC

## 2023-01-09 MED ORDER — EPHEDRINE SULFATE-NACL 50-0.9 MG/10ML-% IV SOSY
PREFILLED_SYRINGE | INTRAVENOUS | Status: DC | PRN
  Administered 2023-01-09 (×5): 5 mg via INTRAVENOUS

## 2023-01-09 MED ORDER — FENTANYL CITRATE (PF) 100 MCG/2ML IJ SOLN
INTRAMUSCULAR | Status: DC | PRN
  Administered 2023-01-09: 25 ug via INTRAVENOUS
  Administered 2023-01-09: 50 ug via INTRAVENOUS
  Administered 2023-01-09 (×2): 25 ug via INTRAVENOUS

## 2023-01-09 MED ORDER — DEXAMETHASONE SODIUM PHOSPHATE 10 MG/ML IJ SOLN
INTRAMUSCULAR | Status: DC | PRN
  Administered 2023-01-09: 4 mg via INTRAVENOUS

## 2023-01-09 MED ORDER — ORAL CARE MOUTH RINSE: 15.0000 mL | Freq: Once | OROMUCOSAL | Status: AC

## 2023-01-09 MED ORDER — CIPROFLOXACIN IN D5W 400 MG/200ML IV SOLN
400.0000 mg | Freq: Two times a day (BID) | INTRAVENOUS | Status: DC
  Administered 2023-01-09: 400 mg via INTRAVENOUS
  Filled 2023-01-09: qty 200

## 2023-01-09 MED ORDER — ONDANSETRON HCL 4 MG/2ML IJ SOLN
INTRAMUSCULAR | Status: AC
  Filled 2023-01-09: qty 2

## 2023-01-09 MED ORDER — PROPOFOL 10 MG/ML IV BOLUS
INTRAVENOUS | Status: DC | PRN
  Administered 2023-01-09: 30 mg via INTRAVENOUS
  Administered 2023-01-09: 150 mg via INTRAVENOUS

## 2023-01-09 MED ORDER — OXYBUTYNIN CHLORIDE 5 MG PO TABS: 5.0000 mg | ORAL_TABLET | Freq: Three times a day (TID) | ORAL | Status: DC | PRN

## 2023-01-09 MED ORDER — OXYCODONE-ACETAMINOPHEN 5-325 MG PO TABS: 1.0000 | ORAL_TABLET | ORAL | Status: DC | PRN

## 2023-01-09 MED ORDER — OXYCODONE HCL 5 MG/5ML PO SOLN: 5.0000 mg | Freq: Once | ORAL | Status: DC | PRN

## 2023-01-09 MED ORDER — 0.9 % SODIUM CHLORIDE (POUR BTL) OPTIME
TOPICAL | Status: DC | PRN
  Administered 2023-01-09 (×2): 1000 mL

## 2023-01-09 SURGICAL SUPPLY — 20 items
BAG DRN RND TRDRP ANRFLXCHMBR (UROLOGICAL SUPPLIES) ×2
BAG URINE DRAIN 2000ML AR STRL (UROLOGICAL SUPPLIES) ×2 IMPLANT
BAG URO CATCHER STRL LF (MISCELLANEOUS) ×2 IMPLANT
CATH FOLEY 3WAY 30CC 22FR (CATHETERS) IMPLANT
CLOTH BEACON ORANGE TIMEOUT ST (SAFETY) ×2 IMPLANT
DRAPE FOOT SWITCH (DRAPES) ×2 IMPLANT
GLOVE SURG LX STRL 7.5 STRW (GLOVE) ×2 IMPLANT
GOWN STRL REUS W/ TWL XL LVL3 (GOWN DISPOSABLE) ×2 IMPLANT
GOWN STRL REUS W/TWL XL LVL3 (GOWN DISPOSABLE) ×2
HOLDER FOLEY CATH W/STRAP (MISCELLANEOUS) IMPLANT
KIT TURNOVER KIT A (KITS) IMPLANT
LASER FIB FLEXIVA PULSE ID 550 (Laser) IMPLANT
LASER FIB FLEXIVA PULSE ID 910 (Laser) IMPLANT
LOOP CUT BIPOLAR 24F LRG (ELECTROSURGICAL) IMPLANT
MANIFOLD NEPTUNE II (INSTRUMENTS) ×2 IMPLANT
PACK CYSTO (CUSTOM PROCEDURE TRAY) ×2 IMPLANT
SYR TOOMEY IRRIG 70ML (MISCELLANEOUS) ×2
SYRINGE TOOMEY IRRIG 70ML (MISCELLANEOUS) ×2 IMPLANT
TUBING CONNECTING 10 (TUBING) ×2 IMPLANT
TUBING UROLOGY SET (TUBING) ×2 IMPLANT

## 2023-01-09 NOTE — Op Note (Signed)
Operative Note  Preoperative diagnosis:  1.  BPH with LUTS 2.  3 cm bladder stone  Postoperative diagnosis: Same  Procedure(s): 1.  Cystolitholapaxy of 3 cm bladder stone 2.  Bipolar TURP  Surgeon: Rhoderick Moody, MD  Assistants:  None  Anesthesia:  General  Complications:  None  EBL: 100 mL  Specimens: 1.  Bladder stone 2.  Prostate chips  Drains/Catheters: 1.  22 French three-way Foley catheter with 30 mL of sterile water in the balloon  Intraoperative findings:   3 cm bladder stone Trilobar prostatic urethral obstruction No other intravesical abnormalities were seen  Indication:  Spencer Cunningham is a 67 y.o. male with a 3 cm bladder stone and a prolonged history of BPH with LUTS.  He has been consented for the above procedures, voices understanding and wishes to proceed.  Description of procedure:  After informed consent was obtained, the patient was brought to the operating room and general anesthesia was administered. The patient was then placed in the dorsolithotomy position and prepped and draped in usual sterile fashion. A timeout was performed. A 23 French rigid cystoscope was then inserted into the urethral meatus and advanced into the bladder under direct vision. A complete bladder survey revealed a 3 cm bladder stone with no other intravesical abnormalities.  A 1000 W laser was then used to fracture the bladder stone into numerous smaller pieces that were then siphon out of the lumen of the bladder through the sheath of the cystoscope.  The rigid cystoscope was then exchanged for a 26 French resectoscope with a bipolar loop working element.  Starting at the bladder neck and progressing distally to the verumontanum, the prostatic adenoma was systematically resected until a widely patent prostatic urethral channel was created.  All prostate chips were then hand irrigated out of the bladder and sent to pathology for permanent section.  The resectoscope was  then removed and exchanged for a 22 French three-way Foley catheter.  The three-way Foley catheter was then extensively hand irrigated until the irrigant returned clear to light pink.  The catheter was then placed to continuous bladder irrigation and placed on rubber band traction.  He tolerated the procedure well and was transferred to the postanesthesia unit in stable condition.  Plan:  CBI overnight

## 2023-01-09 NOTE — Anesthesia Postprocedure Evaluation (Signed)
Anesthesia Post Note  Patient: Spencer Cunningham  Procedure(s) Performed: CYSTOSCOPY WITH LITHOLAPAXY (Bladder) TRANSURETHRAL RESECTION OF THE PROSTATE (TURP) (Prostate)     Patient location during evaluation: PACU Anesthesia Type: General Level of consciousness: awake and alert, oriented and patient cooperative Pain management: pain level controlled Vital Signs Assessment: post-procedure vital signs reviewed and stable Respiratory status: spontaneous breathing, nonlabored ventilation and respiratory function stable Cardiovascular status: blood pressure returned to baseline and stable Postop Assessment: no apparent nausea or vomiting Anesthetic complications: no   No notable events documented.  Last Vitals:  Vitals:   01/09/23 0945 01/09/23 1000  BP: 109/69 112/72  Pulse: 65 61  Resp: 13 11  Temp:    SpO2: 96% 99%    Last Pain:  Vitals:   01/09/23 0927  TempSrc:   PainSc: 0-No pain                 Lannie Fields

## 2023-01-09 NOTE — TOC Progression Note (Signed)
Transition of Care [facility]) - Progression Note    Patient Details  Name: Spencer Cunningham MRN: [id number] Date of Birth: 12-23-1955  [facility] CM/SW Contact  Joseangel Nettleton, Olegario Messier, RN Phone Number: 01/09/2023, 3:46 PM  Clinical Narrative: spoke to spouse about d/c plans-home likely to have foley cath for home @ d/c.         Barriers to Discharge: Continued Medical Work up  Expected Discharge Plan and Services   Discharge Planning Services: CM Consult Post Acute Care Choice: Resumption of Svcs/PTA Provider Living arrangements for the past 2 months: Single Family Home                                       Social Determinants of Health (SDOH) Interventions SDOH Screenings   Food Insecurity: No Food Insecurity (01/09/2023)  Housing: Patient Declined (01/09/2023)  Transportation Needs: No Transportation Needs (01/09/2023)  Utilities: Not At Risk (01/09/2023)  Social Connections: Unknown (09/06/2021)   [facility] from [facility]  Tobacco Use: Medium Risk (01/09/2023)    Readmission Risk Interventions     No data to display

## 2023-01-09 NOTE — Anesthesia Procedure Notes (Signed)
Procedure Name: LMA Insertion Date/Time: 01/09/2023 7:36 AM  Performed by: Sindy Guadeloupe, CRNAPre-anesthesia Checklist: Patient identified, Emergency Drugs available, Suction available, Patient being monitored and Timeout performed Patient Re-evaluated:Patient Re-evaluated prior to induction Oxygen Delivery Method: Circle system utilized Preoxygenation: Pre-oxygenation with 100% oxygen Induction Type: IV induction Ventilation: Mask ventilation without difficulty LMA: LMA inserted LMA Size: 4.0 Number of attempts: 1 Tube secured with: Tape Dental Injury: Teeth and Oropharynx as per pre-operative assessment

## 2023-01-09 NOTE — Transfer of Care (Signed)
Immediate Anesthesia Transfer of Care Note  Patient: Spencer Cunningham  Procedure(s) Performed: CYSTOSCOPY WITH LITHOLAPAXY (Bladder) TRANSURETHRAL RESECTION OF THE PROSTATE (TURP) (Prostate)  Patient Location: PACU  Anesthesia Type:General  Level of Consciousness: awake, drowsy, and patient cooperative  Airway & Oxygen Therapy: Patient Spontanous Breathing and Patient connected to face mask oxygen  Post-op Assessment: Report given to RN and Post -op Vital signs reviewed and stable  Post vital signs: Reviewed and stable  Last Vitals:  Vitals Value Taken Time  BP 129/68 01/09/23 0927  Temp    Pulse 59 01/09/23 0928  Resp 18 01/09/23 0928  SpO2 100 % 01/09/23 0928  Vitals shown include unfiled device data.  Last Pain:  Vitals:   01/09/23 0604  TempSrc: Oral  PainSc:       Patients Stated Pain Goal: 3 (01/09/23 0540)  Complications: No notable events documented.

## 2023-01-10 ENCOUNTER — Encounter (HOSPITAL_COMMUNITY): Payer: Self-pay | Admitting: Urology

## 2023-01-10 DIAGNOSIS — N401 Enlarged prostate with lower urinary tract symptoms: Secondary | ICD-10-CM | POA: Diagnosis not present

## 2023-01-10 MED ORDER — PHENAZOPYRIDINE HCL 200 MG PO TABS: 200.0000 mg | ORAL_TABLET | Freq: Three times a day (TID) | ORAL | 0 refills | Status: DC | PRN

## 2023-01-10 MED ORDER — TRAMADOL HCL 50 MG PO TABS: 50.0000 mg | ORAL_TABLET | Freq: Four times a day (QID) | ORAL | 0 refills | Status: AC | PRN

## 2023-01-10 MED ORDER — CIPROFLOXACIN HCL 500 MG PO TABS: 500.0000 mg | ORAL_TABLET | Freq: Two times a day (BID) | ORAL | 0 refills | Status: AC

## 2023-01-10 MED ORDER — OXYBUTYNIN CHLORIDE 5 MG PO TABS
5.0000 mg | ORAL_TABLET | Freq: Three times a day (TID) | ORAL | 1 refills | Status: DC | PRN
Start: 2023-01-10 — End: 2023-12-15

## 2023-01-10 NOTE — Discharge Summary (Signed)
Date of admission: 01/09/2023  Date of discharge: 01/10/2023  Admission diagnosis: 1.  BPH with LUTS 2.  Bladder stone  Discharge diagnosis: Same  Procedures: TURP and cystolithalopaxy on 01/09/23  History and Physical: For full details, please see admission history and physical. Briefly, Spencer Cunningham is a 67 y.o. year old patient with a 3 cm bladder stone along with BPH and LUTS.   Hospital Course: s/p TURP and cystolithalopaxy on 01/09/23.  Routine post-op course.  Patient was discharged with Foley catheter with plans for a voiding trial on 9/23.  He will restart Xarelto at that time.   Physical Exam:  General: Alert and oriented GU:  Foley catheter draining light pink urine   Laboratory values:  Recent Labs    01/10/23 0515  HGB 13.3  HCT 40.0   Recent Labs    01/10/23 0515  CREATININE 0.71    Disposition: Home  Discharge instruction: The patient was instructed to be ambulatory but told to refrain from heavy lifting, strenuous activity, or driving.  Discharge medications:  Allergies as of 01/10/2023       Reactions   Penicillins Other (See Comments)   Childhood allergy., rash        Medication List     STOP taking these medications    Rivaroxaban 15 MG Tabs tablet Commonly known as: XARELTO       TAKE these medications    atorvastatin 40 MG tablet Commonly known as: LIPITOR Take 40 mg by mouth in the morning.   ciprofloxacin 500 MG tablet Commonly known as: Cipro Take 1 tablet (500 mg total) by mouth 2 (two) times daily for 3 days. Start on 01/14/23   DULoxetine 60 MG capsule Commonly known as: CYMBALTA Take 60 mg by mouth in the morning.   oxybutynin 5 MG tablet Commonly known as: DITROPAN Take 1 tablet (5 mg total) by mouth every 8 (eight) hours as needed for bladder spasms.   phenazopyridine 200 MG tablet Commonly known as: Pyridium Take 1 tablet (200 mg total) by mouth 3 (three) times daily as needed (for pain with urination).    traMADol 50 MG tablet Commonly known as: Ultram Take 1 tablet (50 mg total) by mouth every 6 (six) hours as needed for up to 5 days.        Followup:   Follow-up Information     [facility] Follow up on 01/15/2023.   Why: Post-op appointment for catheter removal at 0845 Contact information: [address] [place] [address] [place] [address] [phone]

## 2023-01-10 NOTE — Plan of Care (Signed)
  Problem: Education: Goal: Knowledge of the prescribed therapeutic regimen will improve Outcome: Progressing   Problem: Bladder/Genitourinary: Goal: Urinary functional status for postoperative course will improve Outcome: Progressing   Problem: Clinical Measurements: Goal: Ability to maintain clinical measurements within normal limits will improve Outcome: Progressing

## 2023-11-20 ENCOUNTER — Ambulatory Visit (HOSPITAL_COMMUNITY): Payer: Self-pay | Admitting: Physician Assistant

## 2023-12-07 NOTE — Progress Notes (Signed)
 Surgical Instructions   Your procedure is scheduled on Thursday December 13, 2023. Report to [facility] Main Entrance A at 8:45 A.M., then check in with the Admitting office. Any questions or running late day of surgery: call [phone]  Questions prior to your surgery date: call [phone], Monday-Friday, 8am-4pm. If you experience any cold or flu symptoms such as cough, fever, chills, shortness of breath, etc. between now and your scheduled surgery, please notify us  at the above number.     Remember:  Do not eat after midnight the night before your surgery  You may drink clear liquids until 7:45 the morning of your surgery.   Clear liquids allowed are: Water , Non-Citrus Juices (without pulp), Carbonated Beverages, Clear Tea (no milk, honey, etc.), Black Coffee Only (NO MILK, CREAM OR POWDERED CREAMER of any kind), and Gatorade.    Take these medicines the morning of surgery with A SIP OF WATER   atorvastatin  (LIPITOR)  DULoxetine  (CYMBALTA )   PER YOUR SURGEON'S INSTRUCTIONS, PLEASE XARELTO  HOLD YOUR 24 HOURS PRIOR TO SURGERY WITH THE LAST DAY BEING 8/19/20205  One week prior to surgery, STOP taking any Aspirin (unless otherwise instructed by your surgeon) Aleve, Naproxen, Ibuprofen, Motrin, Advil, Goody's, BC's, all herbal medications, fish oil, and non-prescription vitamins.                     Do NOT Smoke (Tobacco/Vaping) for 24 hours prior to your procedure.  If you use a CPAP at night, you may bring your mask/headgear for your overnight stay.   You will be asked to remove any contacts, glasses, piercing's, hearing aid's, dentures/partials prior to surgery. Please bring cases for these items if needed.    Patients discharged the day of surgery will not be allowed to drive home, and someone needs to stay with them for 24 hours.  SURGICAL WAITING ROOM VISITATION Patients may have no more than 2 support people in the waiting area - these visitors may rotate.   Pre-op nurse  will coordinate an appropriate time for 1 ADULT support person, who may not rotate, to accompany patient in pre-op.  Children under the age of 39 must have an adult with them who is not the patient and must remain in the main waiting area with an adult.  If the patient needs to stay at the hospital during part of their recovery, the visitor guidelines for inpatient rooms apply.  Please refer to the [facility] website for the visitor guidelines for any additional information.   If you received a COVID test during your pre-op visit  it is requested that you wear a mask when out in public, stay away from anyone that may not be feeling well and notify your surgeon if you develop symptoms. If you have been in contact with anyone that has tested positive in the last 10 days please notify you surgeon.      Pre-operative 5 CHG Bathing Instructions   You can play a key role in reducing the risk of infection after surgery. Your skin needs to be as free of germs as possible. You can reduce the number of germs on your skin by washing with CHG (chlorhexidine  gluconate) soap before surgery. CHG is an antiseptic soap that kills germs and continues to kill germs even after washing.   DO NOT use if you have an allergy to chlorhexidine /CHG or antibacterial soaps. If your skin becomes reddened or irritated, stop using the CHG and notify one of our RNs at [phone].  Please shower with the CHG soap starting 4 days before surgery using the following schedule:     Please keep in mind the following:  You may shave your face at any point before/day of surgery.  Place clean sheets on your bed the day you start using CHG soap. Use a clean washcloth (not used since being washed) for each shower. DO NOT sleep with pets once you start using the CHG.   CHG Shower Instructions:  Wash your face and private area with normal soap. If you choose to wash your hair, wash first with your normal shampoo.  After you use  shampoo/soap, rinse your hair and body thoroughly to remove shampoo/soap residue.  Turn the water  OFF and apply about 3 tablespoons (45 ml) of CHG soap to a CLEAN washcloth.  Apply CHG soap ONLY FROM YOUR NECK DOWN TO YOUR TOES (washing for 3-5 minutes)  DO NOT use CHG soap on face, private areas, open wounds, or sores.  Pay special attention to the area where your surgery is being performed.  If you are having back surgery, having someone wash your back for you may be helpful. Wait 2 minutes after CHG soap is applied, then you may rinse off the CHG soap.  Pat dry with a clean towel  Put on clean clothes/pajamas   If you choose to wear lotion, please use ONLY the CHG-compatible lotions that are listed below.  Additional instructions for the day of surgery: DO NOT APPLY any lotions, deodorants or cologne.   Do not bring valuables to the hospital. [facility] is not responsible for any belongings/valuables. Do not wear jewelry  Put on clean/comfortable clothes.  Please brush your teeth.  Ask your nurse before applying any prescription medications to the skin.     CHG Compatible Lotions   Aveeno Moisturizing lotion  Cetaphil Moisturizing Cream  Cetaphil Moisturizing Lotion  Clairol Herbal Essence Moisturizing Lotion, Dry Skin  Clairol Herbal Essence Moisturizing Lotion, Extra Dry Skin  Clairol Herbal Essence Moisturizing Lotion, Normal Skin  Curel Age Defying Therapeutic Moisturizing Lotion with Alpha Hydroxy  Curel Extreme Care Body Lotion  Curel Soothing Hands Moisturizing Hand Lotion  Curel Therapeutic Moisturizing Cream, Fragrance-Free  Curel Therapeutic Moisturizing Lotion, Fragrance-Free  Curel Therapeutic Moisturizing Lotion, Original Formula  Eucerin Daily Replenishing Lotion  Eucerin Dry Skin Therapy Plus Alpha Hydroxy Crme  Eucerin Dry Skin Therapy Plus Alpha Hydroxy Lotion  Eucerin Original Crme  Eucerin Original Lotion  Eucerin Plus Crme Eucerin Plus Lotion   Eucerin TriLipid Replenishing Lotion  Keri Anti-Bacterial Hand Lotion  Keri Deep Conditioning Original Lotion Dry Skin Formula Softly Scented  Keri Deep Conditioning Original Lotion, Fragrance Free Sensitive Skin Formula  Keri Lotion Fast Absorbing Fragrance Free Sensitive Skin Formula  Keri Lotion Fast Absorbing Softly Scented Dry Skin Formula  Keri Original Lotion  Keri Skin Renewal Lotion Keri Silky Smooth Lotion  Keri Silky Smooth Sensitive Skin Lotion  Nivea Body Creamy Conditioning Oil  Nivea Body Extra Enriched Lotion  Nivea Body Original Lotion  Nivea Body Sheer Moisturizing Lotion Nivea Crme  Nivea Skin Firming Lotion  NutraDerm 30 Skin Lotion  NutraDerm Skin Lotion  NutraDerm Therapeutic Skin Cream  NutraDerm Therapeutic Skin Lotion  ProShield Protective Hand Cream  Provon moisturizing lotion  Please read over the following fact sheets that you were given.

## 2023-12-10 ENCOUNTER — Encounter (HOSPITAL_COMMUNITY)
Admission: RE | Admit: 2023-12-10 | Discharge: 2023-12-10 | Disposition: A | Discharge status: Home / Self Care | Source: Ambulatory Visit | Attending: Orthopedic Surgery | Admitting: Orthopedic Surgery

## 2023-12-10 ENCOUNTER — Other Ambulatory Visit: Payer: Self-pay

## 2023-12-10 ENCOUNTER — Encounter (HOSPITAL_COMMUNITY): Payer: Self-pay

## 2023-12-10 VITALS — BP 120/86 | HR 71 | Temp 98.4°F | Resp 17 | Ht 69.0 in | Wt 186.5 lb

## 2023-12-10 DIAGNOSIS — M961 Postlaminectomy syndrome, not elsewhere classified: Secondary | ICD-10-CM | POA: Insufficient documentation

## 2023-12-10 DIAGNOSIS — Z87891 Personal history of nicotine dependence: Secondary | ICD-10-CM | POA: Insufficient documentation

## 2023-12-10 DIAGNOSIS — Z85819 Personal history of malignant neoplasm of unspecified site of lip, oral cavity, and pharynx: Secondary | ICD-10-CM | POA: Insufficient documentation

## 2023-12-10 DIAGNOSIS — Z86718 Personal history of other venous thrombosis and embolism: Secondary | ICD-10-CM | POA: Insufficient documentation

## 2023-12-10 DIAGNOSIS — Z01818 Encounter for other preprocedural examination: Secondary | ICD-10-CM

## 2023-12-10 DIAGNOSIS — Z01812 Encounter for preprocedural laboratory examination: Secondary | ICD-10-CM | POA: Insufficient documentation

## 2023-12-10 HISTORY — DX: Acute embolism and thrombosis of unspecified deep veins of unspecified lower extremity: I82.409

## 2023-12-10 LAB — CBC
HCT: 45.1 % (ref 39.0–52.0)
Hemoglobin: 15 g/dL (ref 13.0–17.0)
MCH: 33 pg (ref 26.0–34.0)
MCHC: 33.3 g/dL (ref 30.0–36.0)
MCV: 99.1 fL (ref 80.0–100.0)
Platelets: 210 K/uL (ref 150–400)
RBC: 4.55 MIL/uL (ref 4.22–5.81)
RDW: 14.1 % (ref 11.5–15.5)
WBC: 5.6 K/uL (ref 4.0–10.5)
nRBC: 0 % (ref 0.0–0.2)

## 2023-12-10 LAB — SURGICAL PCR SCREEN
MRSA, PCR: NEGATIVE
Staphylococcus aureus: NEGATIVE

## 2023-12-10 LAB — TYPE AND SCREEN
ABO/RH(D): A POS
Antibody Screen: NEGATIVE

## 2023-12-10 NOTE — Progress Notes (Signed)
 PCP - Dr. Garnette Gleason Cardiologist -   PPM/ICD - denies Device Orders - na Rep Notified - na  Chest x-ray - 07/31/2022 EKG - na Stress Test -  ECHO -  Cardiac Cath -   Sleep Study - denies CPAP - na  Non-diabetic  Blood Thinner Instructions: Xarelto , hold 24 hours, states he stopped 12/08/2023 Aspirin Instructions: na  ERAS Protcol -Clears until 0745  Anesthesia review: Yes. Hx PE on xarelto   Patient denies shortness of breath, fever, cough and chest pain at PAT appointment   All instructions explained to the patient, with a verbal understanding of the material. Patient agrees to go over the instructions while at home for a better understanding. Patient also instructed to self quarantine after being tested for COVID-19. The opportunity to ask questions was provided.

## 2023-12-11 ENCOUNTER — Encounter (HOSPITAL_COMMUNITY): Payer: Self-pay

## 2023-12-11 NOTE — Progress Notes (Signed)
 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  History includes former smoker (quit 10/08/2010), left tonsillar SCC cancer (2014), PE (~ 2014; post-op 07/27/22), DVT (RLE ~ 2002; LLE DVT 11/20/04/RLE DVT (06/23/2022), spinal surgery (right L2-3 diskectomy 07/28/2009; C3-7 ACDF 07/21/2022), osteoarthritis (s/p right THA 03/21/04; left THA 10/17/2013), BPH/bladder stone (s/p cystolitholapaxy, bipolar TURP 01/09/2023).  He received surgical clearance fro Lennice Clarity, PA-C with [facility] following 11/01/2023 evaluation with labs and EKG. Given permission to hold Xarelto  for 24 hours and resume when safe from surgery standpoint. He reported last dose 12/08/23. Evaluated on 11/01/2023. Will request copy of EKG tracing.   Anesthesia team to evaluate on the day of surgery.    VS: BP 120/86   Pulse 71   Temp 36.9 C   Resp 17   Ht 5' 9 (1.753 m)   Wt 84.6 kg   SpO2 98%   BMI 27.54 kg/m    PROVIDERS: Nanci Senior, MD is PCP    LABS: Labs reviewed: Acceptable for surgery. (all labs ordered are listed, but only abnormal results are displayed)  Labs Reviewed  SURGICAL PCR SCREEN  CBC  TYPE AND SCREEN   CMP on 11/01/2023 [facility]) showed glucose 89, BUN 24, creatinine 0.78, EGFR 97, sodium 142, potassium 5.0, calcium  9.2, albumin 3.7, total bilirubin 0.6, alkaline phosphatase 50, AST 21, ALT 10   EKG: 11/01/2023: Requested from PCP.   CV: N/A  Past Medical History:  Diagnosis Date   Arthritis    OA- everywhere    Cancer  (HCC)    tonsil   Depression    DVT (deep venous thrombosis) (HCC)    RLE; LLE 11/20/04   History of kidney stones    Pulmonary embolism (HCC) 06/22/2012   in [facility], treated first /w Heparin , then Coumadin & now Xarelto ; post-op 07/27/2022    Past Surgical History:  Procedure Laterality Date   ANTERIOR CERVICAL DECOMPRESSION/DISCECTOMY FUSION 4 LEVELS N/A 07/20/2022   Procedure: ANTERIOR CERVICAL DECOMPRESSION/DISCECTOMY FUSION 4 LEVELS C3-C7;  Surgeon: Burnetta Aures, MD;  Location: [facility] OR;  Service: Orthopedics;  Laterality: N/A;  4.5 hrs 3 C-Bed   BACK SURGERY  2008   lumbar   CERVICAL LAMINECTOMY  2002   CYSTOSCOPY WITH LITHOLAPAXY N/A 01/09/2023   Procedure: CYSTOSCOPY WITH LITHOLAPAXY;  Surgeon: Devere Lonni Righter, MD;  Location: [facility] ORS;  Service: Urology;  Laterality: N/A;   HIP RESECTION ARTHROPLASTY Right 2000   PORTACATH PLACEMENT Bilateral 2014   SHOULDER ARTHROSCOPY W/ ROTATOR CUFF REPAIR Right 2010   TOTAL HIP ARTHROPLASTY Left 10/17/2013   Procedure: LEFT TOTAL HIP ARTHROPLASTY ANTERIOR APPROACH;  Surgeon: Dempsey Melodi GAILS, MD;  Location: [facility] OR;  Service: Orthopedics;  Laterality: Left;   TRANSURETHRAL RESECTION OF PROSTATE N/A 01/09/2023   Procedure: TRANSURETHRAL RESECTION OF THE PROSTATE (TURP);  Surgeon: Devere Lonni Righter, MD;  Location: [facility]S;  Service: Urology;  Laterality: N/A;    MEDICATIONS:  atorvastatin  (LIPITOR) 40 MG tablet   DULoxetine  (CYMBALTA ) 60 MG capsule   Multiple Vitamins-Minerals (MULTIVITAMIN GUMMIES  ADULT) CHEW   oxybutynin  (DITROPAN ) 5 MG tablet   phenazopyridine  (PYRIDIUM ) 200 MG tablet   traZODone  (DESYREL ) 100 MG tablet   XARELTO  20 MG TABS tablet   No current facility-administered medications for this encounter.    Isaiah Ruder, PA-C Surgical Short Stay/Anesthesiology [facility] Phone [phone] [facility] Phone [phone] 12/11/2023 5:33 PM

## 2023-12-11 NOTE — Anesthesia Preprocedure Evaluation (Signed)
 Anesthesia Evaluation  Patient identified by MRN, date of birth, ID band Patient awake    Reviewed: Allergy & Precautions, NPO status , Patient's Chart, lab work & pertinent test results  Airway Mallampati: II  TM Distance: >3 FB Neck ROM: Limited    Dental no notable dental hx. (+) Teeth Intact, Caps, Dental Advisory Given   Pulmonary former smoker, PE   Pulmonary exam normal breath sounds clear to auscultation       Cardiovascular + DVT  Normal cardiovascular exam Rhythm:Regular Rate:Normal     Neuro/Psych  PSYCHIATRIC DISORDERS  Depression     Neuromuscular disease    GI/Hepatic negative GI ROS, Neg liver ROS,,,  Endo/Other  Hyperlipidemia  Renal/GU Hx/o renal calculi Bladder dysfunction      Musculoskeletal  (+) Arthritis , Osteoarthritis,  Post laminectomy lumbar stenosis Hx/o cervical fusion- limited movement   Abdominal   Peds  Hematology  (+) Blood dyscrasia, anemia Xarelto  therapy- last dose 5 days ago   Anesthesia Other Findings   Reproductive/Obstetrics                              Anesthesia Physical Anesthesia Plan  ASA: 2  Anesthesia Plan: General   Post-op Pain Management: Dilaudid  IV, Precedex, Ofirmev  IV (intra-op)* and Ketamine  IV*   Induction: Intravenous  PONV Risk Score and Plan: 3 and Treatment may vary due to age or medical condition, Ondansetron  and Dexamethasone   Airway Management Planned: Oral ETT and Video Laryngoscope Planned  Additional Equipment: None  Intra-op Plan:   Post-operative Plan:   Informed Consent: I have reviewed the patients History and Physical, chart, labs and discussed the procedure including the risks, benefits and alternatives for the proposed anesthesia with the patient or authorized representative who has indicated his/her understanding and acceptance.     Dental advisory given  Plan Discussed with: CRNA and  Anesthesiologist  Anesthesia Plan Comments: (PAT note written 12/11/2023 by Allison Zelenak, PA-C.  )         Anesthesia Quick Evaluation

## 2023-12-13 ENCOUNTER — Encounter (HOSPITAL_COMMUNITY): Payer: Self-pay | Admitting: Vascular Surgery

## 2023-12-13 ENCOUNTER — Other Ambulatory Visit: Payer: Self-pay

## 2023-12-13 ENCOUNTER — Inpatient Hospital Stay (HOSPITAL_COMMUNITY): Payer: Self-pay | Admitting: Certified Registered"

## 2023-12-13 ENCOUNTER — Inpatient Hospital Stay (HOSPITAL_COMMUNITY)

## 2023-12-13 ENCOUNTER — Encounter (HOSPITAL_COMMUNITY)
Admission: RE | Disposition: A | Discharge status: Home / Self Care | Payer: Self-pay | Source: Home / Self Care | Attending: Orthopedic Surgery

## 2023-12-13 ENCOUNTER — Encounter (HOSPITAL_COMMUNITY): Payer: Self-pay | Admitting: Orthopedic Surgery

## 2023-12-13 ENCOUNTER — Inpatient Hospital Stay (HOSPITAL_COMMUNITY)
Admission: RE | Admit: 2023-12-13 | Discharge: 2023-12-15 | DRG: 451 | Disposition: A | Discharge status: Home / Self Care | Attending: Orthopedic Surgery | Admitting: Orthopedic Surgery

## 2023-12-13 DIAGNOSIS — M48061 Spinal stenosis, lumbar region without neurogenic claudication: Secondary | ICD-10-CM | POA: Diagnosis present

## 2023-12-13 DIAGNOSIS — Z880 Allergy status to penicillin: Secondary | ICD-10-CM

## 2023-12-13 DIAGNOSIS — Z86718 Personal history of other venous thrombosis and embolism: Secondary | ICD-10-CM

## 2023-12-13 DIAGNOSIS — Z981 Arthrodesis status: Principal | ICD-10-CM

## 2023-12-13 DIAGNOSIS — F32A Depression, unspecified: Secondary | ICD-10-CM | POA: Diagnosis present

## 2023-12-13 DIAGNOSIS — Z79899 Other long term (current) drug therapy: Secondary | ICD-10-CM

## 2023-12-13 DIAGNOSIS — Z7901 Long term (current) use of anticoagulants: Secondary | ICD-10-CM

## 2023-12-13 DIAGNOSIS — Z96642 Presence of left artificial hip joint: Secondary | ICD-10-CM | POA: Diagnosis present

## 2023-12-13 DIAGNOSIS — M51369 Other intervertebral disc degeneration, lumbar region without mention of lumbar back pain or lower extremity pain: Secondary | ICD-10-CM | POA: Diagnosis present

## 2023-12-13 DIAGNOSIS — M5116 Intervertebral disc disorders with radiculopathy, lumbar region: Secondary | ICD-10-CM | POA: Diagnosis present

## 2023-12-13 DIAGNOSIS — Z96643 Presence of artificial hip joint, bilateral: Secondary | ICD-10-CM | POA: Diagnosis present

## 2023-12-13 DIAGNOSIS — M961 Postlaminectomy syndrome, not elsewhere classified: Secondary | ICD-10-CM | POA: Diagnosis present

## 2023-12-13 DIAGNOSIS — Z85818 Personal history of malignant neoplasm of other sites of lip, oral cavity, and pharynx: Secondary | ICD-10-CM

## 2023-12-13 DIAGNOSIS — Z87442 Personal history of urinary calculi: Secondary | ICD-10-CM | POA: Diagnosis not present

## 2023-12-13 DIAGNOSIS — Z86711 Personal history of pulmonary embolism: Secondary | ICD-10-CM | POA: Diagnosis not present

## 2023-12-13 DIAGNOSIS — Z87891 Personal history of nicotine dependence: Secondary | ICD-10-CM | POA: Diagnosis not present

## 2023-12-13 DIAGNOSIS — Z9079 Acquired absence of other genital organ(s): Secondary | ICD-10-CM | POA: Diagnosis not present

## 2023-12-13 HISTORY — PX: ANTERIOR AND POSTERIOR SPINAL FUSION: SHX2259

## 2023-12-13 SURGERY — ANTERIOR AND POSTERIOR SPINAL FUSION
Anesthesia: General | Site: Spine Lumbar

## 2023-12-13 MED ORDER — SODIUM CHLORIDE 0.9% FLUSH
3.0000 mL | Freq: Two times a day (BID) | INTRAVENOUS | Status: DC
  Administered 2023-12-13: 3 mL via INTRAVENOUS

## 2023-12-13 MED ORDER — OXYCODONE HCL 5 MG PO TABS
10.0000 mg | ORAL_TABLET | ORAL | Status: DC | PRN
  Administered 2023-12-13 – 2023-12-15 (×9): 10 mg via ORAL
  Filled 2023-12-13 (×11): qty 2

## 2023-12-13 MED ORDER — HYDROMORPHONE HCL 1 MG/ML IJ SOLN
INTRAMUSCULAR | Status: AC
  Filled 2023-12-13: qty 1

## 2023-12-13 MED ORDER — SUCCINYLCHOLINE CHLORIDE 200 MG/10ML IV SOSY
PREFILLED_SYRINGE | INTRAVENOUS | Status: AC
  Filled 2023-12-13: qty 10

## 2023-12-13 MED ORDER — ONDANSETRON HCL 4 MG/2ML IJ SOLN: 4.0000 mg | Freq: Four times a day (QID) | INTRAMUSCULAR | Status: DC | PRN

## 2023-12-13 MED ORDER — HYDROMORPHONE HCL 1 MG/ML IJ SOLN
0.2500 mg | INTRAMUSCULAR | Status: DC | PRN
  Administered 2023-12-13 (×2): 0.25 mg via INTRAVENOUS
  Administered 2023-12-13 (×2): 0.5 mg via INTRAVENOUS

## 2023-12-13 MED ORDER — ONDANSETRON HCL 4 MG/2ML IJ SOLN
INTRAMUSCULAR | Status: DC | PRN
  Administered 2023-12-13: 4 mg via INTRAVENOUS

## 2023-12-13 MED ORDER — SURGIFLO WITH THROMBIN (HEMOSTATIC MATRIX KIT) OPTIME
TOPICAL | Status: DC | PRN
  Administered 2023-12-13: 1 via TOPICAL

## 2023-12-13 MED ORDER — ACETAMINOPHEN 650 MG RE SUPP: 650.0000 mg | RECTAL | Status: DC | PRN

## 2023-12-13 MED ORDER — MIDAZOLAM HCL 2 MG/2ML IJ SOLN
INTRAMUSCULAR | Status: DC | PRN
  Administered 2023-12-13: 2 mg via INTRAVENOUS

## 2023-12-13 MED ORDER — LIDOCAINE 2% (20 MG/ML) 5 ML SYRINGE
INTRAMUSCULAR | Status: AC
  Filled 2023-12-13: qty 5

## 2023-12-13 MED ORDER — BUPIVACAINE-EPINEPHRINE 0.25% -1:200000 IJ SOLN
INTRAMUSCULAR | Status: DC | PRN
  Administered 2023-12-13: 8 mL

## 2023-12-13 MED ORDER — SODIUM CHLORIDE 0.9 % IV SOLN
250.0000 mL | INTRAVENOUS | Status: AC
  Administered 2023-12-13: 250 mL via INTRAVENOUS

## 2023-12-13 MED ORDER — OXYCODONE HCL 5 MG PO TABS: 5.0000 mg | ORAL_TABLET | Freq: Once | ORAL | Status: DC | PRN

## 2023-12-13 MED ORDER — PHENYLEPHRINE HCL-NACL 20-0.9 MG/250ML-% IV SOLN: INTRAVENOUS | Status: DC | PRN

## 2023-12-13 MED ORDER — OXYCODONE-ACETAMINOPHEN 10-325 MG PO TABS: 1.0000 | ORAL_TABLET | Freq: Four times a day (QID) | ORAL | 0 refills | Status: DC | PRN

## 2023-12-13 MED ORDER — FENTANYL CITRATE (PF) 250 MCG/5ML IJ SOLN
INTRAMUSCULAR | Status: AC
Start: 2023-12-13 — End: 2023-12-13
  Filled 2023-12-13: qty 5

## 2023-12-13 MED ORDER — ONDANSETRON HCL 4 MG/2ML IJ SOLN: 4.0000 mg | Freq: Once | INTRAMUSCULAR | Status: DC | PRN

## 2023-12-13 MED ORDER — ONDANSETRON HCL 4 MG PO TABS: 4.0000 mg | ORAL_TABLET | Freq: Four times a day (QID) | ORAL | Status: DC | PRN

## 2023-12-13 MED ORDER — HYDROMORPHONE HCL 1 MG/ML IJ SOLN
INTRAMUSCULAR | Status: AC
  Filled 2023-12-13: qty 0.5

## 2023-12-13 MED ORDER — METHOCARBAMOL 500 MG PO TABS
500.0000 mg | ORAL_TABLET | Freq: Four times a day (QID) | ORAL | Status: DC | PRN
  Administered 2023-12-13 – 2023-12-15 (×4): 500 mg via ORAL
  Filled 2023-12-13 (×4): qty 1

## 2023-12-13 MED ORDER — THROMBIN 20000 UNITS EX SOLR
CUTANEOUS | Status: AC
  Filled 2023-12-13: qty 20000

## 2023-12-13 MED ORDER — DULOXETINE HCL 60 MG PO CPEP
60.0000 mg | ORAL_CAPSULE | Freq: Every morning | ORAL | Status: DC
  Administered 2023-12-14 – 2023-12-15 (×2): 60 mg via ORAL
  Filled 2023-12-13 (×2): qty 1

## 2023-12-13 MED ORDER — LACTATED RINGERS IV SOLN
INTRAVENOUS | Status: DC | PRN
Start: 2023-12-13 — End: 2023-12-13

## 2023-12-13 MED ORDER — TRAZODONE HCL 50 MG PO TABS
100.0000 mg | ORAL_TABLET | Freq: Every evening | ORAL | Status: DC | PRN
  Administered 2023-12-13 – 2023-12-14 (×2): 100 mg via ORAL
  Filled 2023-12-13 (×2): qty 2

## 2023-12-13 MED ORDER — PROPOFOL 10 MG/ML IV BOLUS
INTRAVENOUS | Status: AC
  Filled 2023-12-13: qty 20

## 2023-12-13 MED ORDER — HYDROMORPHONE HCL 1 MG/ML IJ SOLN
INTRAMUSCULAR | Status: DC | PRN
  Administered 2023-12-13: .5 mg via INTRAVENOUS

## 2023-12-13 MED ORDER — RIVAROXABAN 10 MG PO TABS: 20.0000 mg | ORAL_TABLET | Freq: Every day | ORAL | Status: DC

## 2023-12-13 MED ORDER — EPHEDRINE SULFATE-NACL 50-0.9 MG/10ML-% IV SOSY
PREFILLED_SYRINGE | INTRAVENOUS | Status: DC | PRN
  Administered 2023-12-13 (×2): 5 mg via INTRAVENOUS

## 2023-12-13 MED ORDER — CEFAZOLIN SODIUM-DEXTROSE 1-4 GM/50ML-% IV SOLN
1.0000 g | Freq: Three times a day (TID) | INTRAVENOUS | Status: AC
  Administered 2023-12-13 – 2023-12-14 (×2): 1 g via INTRAVENOUS
  Filled 2023-12-13 (×2): qty 50

## 2023-12-13 MED ORDER — ORAL CARE MOUTH RINSE: 15.0000 mL | Freq: Once | OROMUCOSAL | Status: AC

## 2023-12-13 MED ORDER — FENTANYL CITRATE (PF) 250 MCG/5ML IJ SOLN
INTRAMUSCULAR | Status: DC | PRN
  Administered 2023-12-13: 50 ug via INTRAVENOUS
  Administered 2023-12-13: 100 ug via INTRAVENOUS

## 2023-12-13 MED ORDER — ACETAMINOPHEN 10 MG/ML IV SOLN
INTRAVENOUS | Status: DC | PRN
  Administered 2023-12-13: 1000 mg via INTRAVENOUS

## 2023-12-13 MED ORDER — PROPOFOL 500 MG/50ML IV EMUL
INTRAVENOUS | Status: DC | PRN
  Administered 2023-12-13: 125 ug/kg/min via INTRAVENOUS
  Administered 2023-12-13: 115 ug/kg/min via INTRAVENOUS

## 2023-12-13 MED ORDER — ACETAMINOPHEN 10 MG/ML IV SOLN
INTRAVENOUS | Status: AC
  Filled 2023-12-13: qty 100

## 2023-12-13 MED ORDER — HYDROMORPHONE HCL 1 MG/ML IJ SOLN
1.0000 mg | INTRAMUSCULAR | Status: AC | PRN
  Administered 2023-12-13: 1 mg via INTRAVENOUS
  Filled 2023-12-13: qty 1

## 2023-12-13 MED ORDER — OXYCODONE HCL 5 MG PO TABS
5.0000 mg | ORAL_TABLET | ORAL | Status: DC | PRN
  Administered 2023-12-13: 5 mg via ORAL

## 2023-12-13 MED ORDER — MIDAZOLAM HCL 2 MG/2ML IJ SOLN
INTRAMUSCULAR | Status: AC
Start: 2023-12-13 — End: 2023-12-13
  Filled 2023-12-13: qty 2

## 2023-12-13 MED ORDER — TRANEXAMIC ACID-NACL 1000-0.7 MG/100ML-% IV SOLN
1000.0000 mg | INTRAVENOUS | Status: AC
  Administered 2023-12-13: 1000 mg via INTRAVENOUS
  Filled 2023-12-13: qty 100

## 2023-12-13 MED ORDER — PHENYLEPHRINE HCL-NACL 20-0.9 MG/250ML-% IV SOLN
INTRAVENOUS | Status: DC | PRN
  Administered 2023-12-13: 30 ug/min via INTRAVENOUS

## 2023-12-13 MED ORDER — CHLORHEXIDINE GLUCONATE 0.12 % MT SOLN
15.0000 mL | Freq: Once | OROMUCOSAL | Status: AC
  Administered 2023-12-13: 15 mL via OROMUCOSAL
  Filled 2023-12-13: qty 15

## 2023-12-13 MED ORDER — BUPIVACAINE-EPINEPHRINE (PF) 0.25% -1:200000 IJ SOLN
INTRAMUSCULAR | Status: AC
  Filled 2023-12-13: qty 30

## 2023-12-13 MED ORDER — ATORVASTATIN CALCIUM 40 MG PO TABS
40.0000 mg | ORAL_TABLET | Freq: Every morning | ORAL | Status: DC
  Administered 2023-12-14 – 2023-12-15 (×2): 40 mg via ORAL
  Filled 2023-12-13 (×2): qty 1

## 2023-12-13 MED ORDER — METHOCARBAMOL 1000 MG/10ML IJ SOLN: 500.0000 mg | Freq: Four times a day (QID) | INTRAMUSCULAR | Status: DC | PRN

## 2023-12-13 MED ORDER — DEXAMETHASONE SODIUM PHOSPHATE 10 MG/ML IJ SOLN
INTRAMUSCULAR | Status: AC
  Filled 2023-12-13: qty 1

## 2023-12-13 MED ORDER — PHENOL 1.4 % MT LIQD: 1.0000 | OROMUCOSAL | Status: DC | PRN

## 2023-12-13 MED ORDER — LIDOCAINE 2% (20 MG/ML) 5 ML SYRINGE
INTRAMUSCULAR | Status: DC | PRN
  Administered 2023-12-13: 80 mg via INTRAVENOUS

## 2023-12-13 MED ORDER — THROMBIN 20000 UNITS EX SOLR
CUTANEOUS | Status: DC | PRN
  Administered 2023-12-13: 20 mL via TOPICAL

## 2023-12-13 MED ORDER — SODIUM CHLORIDE 0.9 % IV SOLN
0.1500 ug/kg/min | Freq: Once | INTRAVENOUS | Status: AC
  Administered 2023-12-13 (×2): .15 ug/kg/min via INTRAVENOUS
  Filled 2023-12-13: qty 2000

## 2023-12-13 MED ORDER — OXYCODONE HCL 5 MG/5ML PO SOLN: 5.0000 mg | Freq: Once | ORAL | Status: DC | PRN

## 2023-12-13 MED ORDER — LACTATED RINGERS IV SOLN: INTRAVENOUS | Status: DC

## 2023-12-13 MED ORDER — CEFAZOLIN SODIUM-DEXTROSE 2-4 GM/100ML-% IV SOLN
2.0000 g | INTRAVENOUS | Status: AC
  Administered 2023-12-13: 2 g via INTRAVENOUS
  Filled 2023-12-13: qty 100

## 2023-12-13 MED ORDER — SODIUM CHLORIDE 0.9% FLUSH
3.0000 mL | INTRAVENOUS | Status: DC | PRN
Start: 2023-12-13 — End: 2023-12-15

## 2023-12-13 MED ORDER — PHENYLEPHRINE 80 MCG/ML (10ML) SYRINGE FOR IV PUSH (FOR BLOOD PRESSURE SUPPORT)
PREFILLED_SYRINGE | INTRAVENOUS | Status: DC | PRN
  Administered 2023-12-13: 160 ug via INTRAVENOUS

## 2023-12-13 MED ORDER — TIZANIDINE HCL 2 MG PO TABS: 2.0000 mg | ORAL_TABLET | Freq: Three times a day (TID) | ORAL | 0 refills | Status: DC | PRN

## 2023-12-13 MED ORDER — MENTHOL 3 MG MT LOZG: 1.0000 | LOZENGE | OROMUCOSAL | Status: DC | PRN

## 2023-12-13 MED ORDER — ONDANSETRON HCL 4 MG PO TABS: 4.0000 mg | ORAL_TABLET | Freq: Three times a day (TID) | ORAL | 0 refills | Status: DC | PRN

## 2023-12-13 MED ORDER — ONDANSETRON HCL 4 MG/2ML IJ SOLN
INTRAMUSCULAR | Status: AC
  Filled 2023-12-13: qty 2

## 2023-12-13 MED ORDER — 0.9 % SODIUM CHLORIDE (POUR BTL) OPTIME
TOPICAL | Status: DC | PRN
  Administered 2023-12-13: 1000 mL

## 2023-12-13 MED ORDER — DEXAMETHASONE SODIUM PHOSPHATE 10 MG/ML IJ SOLN
INTRAMUSCULAR | Status: DC | PRN
  Administered 2023-12-13: 10 mg via INTRAVENOUS

## 2023-12-13 MED ORDER — PROPOFOL 10 MG/ML IV BOLUS
INTRAVENOUS | Status: DC | PRN
  Administered 2023-12-13: 20 mg via INTRAVENOUS
  Administered 2023-12-13: 30 mg via INTRAVENOUS
  Administered 2023-12-13: 20 mg via INTRAVENOUS
  Administered 2023-12-13: 160 mg via INTRAVENOUS
  Administered 2023-12-13: 40 mg via INTRAVENOUS
  Administered 2023-12-13: 20 mg via INTRAVENOUS

## 2023-12-13 MED ORDER — ACETAMINOPHEN 325 MG PO TABS: 650.0000 mg | ORAL_TABLET | ORAL | Status: DC | PRN

## 2023-12-13 MED ORDER — SODIUM CHLORIDE 0.9 % IV SOLN
0.1500 ug/kg/min | Freq: Once | INTRAVENOUS | Status: DC
  Filled 2023-12-13: qty 2000

## 2023-12-13 MED ORDER — SUCCINYLCHOLINE CHLORIDE 200 MG/10ML IV SOSY
PREFILLED_SYRINGE | INTRAVENOUS | Status: DC | PRN
  Administered 2023-12-13: 120 mg via INTRAVENOUS

## 2023-12-13 MED ORDER — POLYETHYLENE GLYCOL 3350 17 G PO PACK: 17.0000 g | PACK | Freq: Every day | ORAL | Status: DC | PRN

## 2023-12-13 MED ORDER — MAGNESIUM CITRATE PO SOLN
1.0000 | Freq: Once | ORAL | Status: AC | PRN
  Administered 2023-12-13: 1 via ORAL
  Filled 2023-12-13: qty 296

## 2023-12-13 MED ORDER — AMISULPRIDE (ANTIEMETIC) 5 MG/2ML IV SOLN: 10.0000 mg | Freq: Once | INTRAVENOUS | Status: DC | PRN

## 2023-12-13 SURGICAL SUPPLY — 72 items
ALLOGRFT BNE OSSIFUSE FBR 5CC (Bone Implant) IMPLANT
BAG COUNTER SPONGE SURGICOUNT (BAG) ×1 IMPLANT
BLADE CLIPPER SURG (BLADE) IMPLANT
BLADE SURG 10 STRL SS (BLADE) ×2 IMPLANT
CLIP APPLIE 11 MED OPEN (CLIP) ×1 IMPLANT
CLSR STERI-STRIP ANTIMIC 1/2X4 (GAUZE/BANDAGES/DRESSINGS) IMPLANT
CORD BIPOLAR FORCEPS 12FT (ELECTRODE) ×1 IMPLANT
COVER SURGICAL LIGHT HANDLE (MISCELLANEOUS) ×2 IMPLANT
DRAPE C-ARM 42X72 X-RAY (DRAPES) ×2 IMPLANT
DRAPE C-ARMOR (DRAPES) ×2 IMPLANT
DRAPE INCISE IOBAN 66X45 STRL (DRAPES) ×1 IMPLANT
DRAPE LAPAROTOMY T 102X78X121 (DRAPES) ×1 IMPLANT
DRAPE SURG 17X23 STRL (DRAPES) ×2 IMPLANT
DRAPE U-SHAPE 47X51 STRL (DRAPES) ×2 IMPLANT
DRSG AQUACEL AG ADV 3.5X 6 (GAUZE/BANDAGES/DRESSINGS) ×2 IMPLANT
DRSG OPSITE POSTOP 4X6 (GAUZE/BANDAGES/DRESSINGS) IMPLANT
DURAPREP 26ML APPLICATOR (WOUND CARE) ×2 IMPLANT
ELECT CAUTERY BLADE 6.4 (BLADE) ×1 IMPLANT
ELECT NVM5 SURFACE MEP/EMG (ELECTRODE) IMPLANT
ELECT PENCIL ROCKER SW 15FT (MISCELLANEOUS) ×2 IMPLANT
ELECTRODE BLDE 4.0 EZ CLN MEGD (MISCELLANEOUS) ×1 IMPLANT
ELECTRODE REM PT RTRN 9FT ADLT (ELECTROSURGICAL) ×2 IMPLANT
GLOVE BIO SURGEON STRL SZ 6.5 (GLOVE) ×2 IMPLANT
GLOVE BIO SURGEON STRL SZ7.5 (GLOVE) IMPLANT
GLOVE BIOGEL PI IND STRL 6.5 (GLOVE) ×2 IMPLANT
GLOVE BIOGEL PI IND STRL 8 (GLOVE) IMPLANT
GLOVE BIOGEL PI IND STRL 8.5 (GLOVE) ×2 IMPLANT
GLOVE OPTIFIT SS 7.5 STRL LX (GLOVE) ×1 IMPLANT
GLOVE SS BIOGEL STRL SZ 8.5 (GLOVE) ×2 IMPLANT
GOWN STRL REUS W/ TWL LRG LVL3 (GOWN DISPOSABLE) ×2 IMPLANT
GOWN STRL REUS W/TWL 2XL LVL3 (GOWN DISPOSABLE) ×2 IMPLANT
GUIDEWIRE NITINOL BEVEL TIP (WIRE) IMPLANT
KIT BASIN OR (CUSTOM PROCEDURE TRAY) ×1 IMPLANT
KIT DILATOR XLIF 5 (KITS) IMPLANT
KIT POSITIONER JACKSON TABLE (MISCELLANEOUS) ×1 IMPLANT
KIT SURGICAL ACCESS MAXCESS 4 (KITS) IMPLANT
KIT TURNOVER KIT B (KITS) ×1 IMPLANT
MARKER PEN SURG W/LABELS BLK (STERILIZATION PRODUCTS) ×1 IMPLANT
MODULE EMG NDL SSEP NVM5 (NEUROSURGERY SUPPLIES) IMPLANT
MODULE EMG NEEDLE SSEP NVM5 (NEUROSURGERY SUPPLIES) ×1 IMPLANT
NDL HYPO 22X1.5 SAFETY MO (MISCELLANEOUS) ×1 IMPLANT
NDL I-PASS III (NEEDLE) IMPLANT
NEEDLE HYPO 22X1.5 SAFETY MO (MISCELLANEOUS) ×1 IMPLANT
NEEDLE I-PASS III (NEEDLE) ×1 IMPLANT
NS IRRIG 1000ML POUR BTL (IV SOLUTION) ×1 IMPLANT
PACK LAMINECTOMY ORTHO (CUSTOM PROCEDURE TRAY) ×1 IMPLANT
PACK UNIVERSAL I (CUSTOM PROCEDURE TRAY) ×2 IMPLANT
PAD ARMBOARD POSITIONER FOAM (MISCELLANEOUS) ×2 IMPLANT
PATTIES SURGICAL .5 X1 (DISPOSABLE) IMPLANT
PUTTY DBM INSTAFILL CART 5CC (Putty) IMPLANT
ROD RELINE MAS LORD 5.5X45MM (Rod) IMPLANT
SCREW LOCK RELINE 5.5 TULIP (Screw) IMPLANT
SCREW RELINE RED 6.5X45MM POLY (Screw) IMPLANT
SPACER RISEL 22X55 7-14MM (Spacer) IMPLANT
SPONGE INTESTINAL PEANUT (DISPOSABLE) ×2 IMPLANT
SPONGE SURGIFOAM ABS GEL 100 (HEMOSTASIS) ×1 IMPLANT
STRIP CLOSURE SKIN 1/2X4 (GAUZE/BANDAGES/DRESSINGS) ×2 IMPLANT
SURGIFLO W/THROMBIN 8M KIT (HEMOSTASIS) IMPLANT
SUT MNCRL AB 3-0 PS2 27 (SUTURE) ×2 IMPLANT
SUT PDS AB 1 CTX 36 (SUTURE) ×2 IMPLANT
SUT SILK 2 0 TIES 10X30 (SUTURE) ×1 IMPLANT
SUT SILK 3 0 TIES 10X30 (SUTURE) ×1 IMPLANT
SUT VIC AB 1 CT1 18XCR BRD 8 (SUTURE) IMPLANT
SUT VIC AB 2-0 CT1 18 (SUTURE) ×2 IMPLANT
SYR BULB IRRIG 60ML STRL (SYRINGE) ×1 IMPLANT
SYR CONTROL 10ML LL (SYRINGE) ×1 IMPLANT
TIP CONICAL INSTAFILL (ORTHOPEDIC DISPOSABLE SUPPLIES) IMPLANT
TOWEL GREEN STERILE (TOWEL DISPOSABLE) ×2 IMPLANT
TOWEL GREEN STERILE FF (TOWEL DISPOSABLE) ×1 IMPLANT
TRAY FOLEY W/BAG SLVR 16FR ST (SET/KITS/TRAYS/PACK) ×1 IMPLANT
WATER STERILE IRR 1000ML POUR (IV SOLUTION) ×1 IMPLANT
YANKAUER SUCT BULB TIP NO VENT (SUCTIONS) ×1 IMPLANT

## 2023-12-13 NOTE — Anesthesia Postprocedure Evaluation (Signed)
 Anesthesia Post Note  Patient: Spencer Cunningham  Procedure(s) Performed: EXTREME LATERAL INTERBODY FUSION LUMBAR TWO-LUMBAR THREE (Spine Lumbar) POSTERIOR SPINAL FUSION LUMBAR TWO-LUMBAR THREE WITH INSTRUMENTATION (Spine Lumbar)     Patient location during evaluation: PACU Anesthesia Type: General Level of consciousness: awake and alert and oriented Pain management: pain level controlled Vital Signs Assessment: post-procedure vital signs reviewed and stable Respiratory status: spontaneous breathing, nonlabored ventilation and respiratory function stable Cardiovascular status: blood pressure returned to baseline and stable Postop Assessment: no apparent nausea or vomiting Anesthetic complications: no   No notable events documented.  Last Vitals:  Vitals:   12/13/23 1530 12/13/23 1545  BP: 96/61 106/67  Pulse: (!) 58 (!) 58  Resp: 16 12  Temp:    SpO2: 94% 95%    Last Pain:  Vitals:   12/13/23 1530  TempSrc:   PainSc: 5                  Seth Friedlander A.

## 2023-12-13 NOTE — Brief Op Note (Signed)
 12/13/2023  2:02 PM  PATIENT:  Spencer Cunningham  68 y.o. male  PRE-OPERATIVE DIAGNOSIS:  Post laminectomy syndrome with recurrent stenosis L2-3  POST-OPERATIVE DIAGNOSIS:  Post laminectomy syndrome with recurrent stenosis L2-3  PROCEDURE:  Procedure(s): EXTREME LATERAL INTERBODY FUSION LUMBAR TWO-LUMBAR THREE (N/A) POSTERIOR SPINAL FUSION LUMBAR TWO-LUMBAR THREE WITH INSTRUMENTATION (N/A)  SURGEON:  Surgeons and Role:    DEWAINE Burnetta Aures, MD - Primary  PHYSICIAN ASSISTANT: Jeoffrey Sages  ASSISTANTS:    ANESTHESIA:   general  EBL:  50 mL   BLOOD ADMINISTERED:none  DRAINS: none   LOCAL MEDICATIONS USED:  MARCAINE      SPECIMEN:  No Specimen  DISPOSITION OF SPECIMEN:  N/A  COUNTS:  YES  TOURNIQUET:  * No tourniquets in log *  DICTATION: .Dragon Dictation  PLAN OF CARE: Admit to inpatient   PATIENT DISPOSITION:  PACU - hemodynamically stable.

## 2023-12-13 NOTE — H&P (Signed)
 History: Spencer Cunningham continues to have severe neuropathic leg pain left side worse than the right. He states that he has relief with forward flexion and intense increased pain with extension or rotation of the spine. He does have back pain but it is not debilitating. His overall quality of life is significantly hindered.  As a result we have elected to move forward with surgery.  Past Medical History:  Diagnosis Date   Arthritis    OA- everywhere    Cancer (HCC)    tonsil   Depression    DVT (deep venous thrombosis) (HCC)    RLE; LLE 11/20/04   History of kidney stones    Pulmonary embolism (HCC) 06/22/2012   in [facility], treated first /w Heparin , then Coumadin & now Xarelto ; post-op 07/27/2022    Allergies  Allergen Reactions   Penicillins Rash    Childhood allergy., rash    No current facility-administered medications on file prior to encounter.   Current Outpatient Medications on File Prior to Encounter  Medication Sig Dispense Refill   atorvastatin  (LIPITOR) 40 MG tablet Take 40 mg by mouth in the morning.     DULoxetine  (CYMBALTA ) 60 MG capsule Take 60 mg by mouth in the morning.     Multiple Vitamins-Minerals (MULTIVITAMIN GUMMIES ADULT) CHEW Chew 1 tablet by mouth daily.     traZODone  (DESYREL ) 100 MG tablet Take 100 mg by mouth at bedtime as needed for sleep.     XARELTO  20 MG TABS tablet Take 20 mg by mouth daily.     oxybutynin  (DITROPAN ) 5 MG tablet Take 1 tablet (5 mg total) by mouth every 8 (eight) hours as needed for bladder spasms. (Patient not taking: Reported on 12/06/2023) 30 tablet 1   phenazopyridine  (PYRIDIUM ) 200 MG tablet Take 1 tablet (200 mg total) by mouth 3 (three) times daily as needed (for pain with urination). (Patient not taking: Reported on 12/06/2023) 30 tablet 0    Physical Exam: Vitals:   12/13/23 0856  BP: 130/88  Pulse: 60  Resp: 18  Temp: 98.2 F (36.8 C)  SpO2: 95%   Body mass index is 27.02 kg/m. Clinical exam: Spencer Cunningham is a  pleasant individual, who appears younger than their stated age.  He is alert and orientated 3.  No shortness of breath, chest pain.  Abdomen is soft and non-tender, negative loss of bowel and bladder control, no rebound tenderness.  Negative: skin lesions abrasions contusions  Peripheral pulses: 2+ peripheral pulses bilaterally. LE compartments are: Soft and nontender.  Gait pattern: Altered gait pattern due to significant back buttock and neuropathic left leg pain  Assistive devices: None  Neuro: 5/5 motor strength in the lower extremity bilaterally. Negative Babinski test, no clonus, negative straight leg raise test. 2+ deep tendon reflexes in the lower extremity bilaterally.  Musculoskeletal: back pain radiating into the lower extremity left worse than the right. No SI joint pain, negative FABER test. Limited range of motion of the lumbar spine due to pain. Patient has limited internal/external rotation of the hip bilaterally but no significant groin or hip pain with range of motion testing. Well-healed surgical scar from prior L2-3 right-sided discectomy in 2011.  Imaging: X-rays of the cervical and lumbar spine were reviewed. 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 Preoperative kyphotic cervical deformity has been addressed cervical lordosis is maintained. Patient has a solid fusion with no evidence of pseudoarthrosis. There has been no migration  or subsidence of the implants. No abnormal motion on flexion-extension views. No adjacent segment degenerative disease noted.  X-rays of the lumbar spine demonstrates multilevel degenerative disc disease. Loss of normal disc height with lateral bone spur formation L4-5 and L5-S1 and to a lesser degree L1-4. Lateral exostosis L1-2 and L2-3 with slight degenerative scoliosis in this region. Significant heterotopic ossification of both hips right side left worse than the left. No significant SI joint  degeneration.  Lumbar MRI: Multilevel degenerative disc disease with Modic type I endplate changes at L2-3 and L4-5. Absence of right L3 superior articular facet. Severe central canal stenosis due to anterior listhesis at L2-3 as well as ligamentum flavum thickening. Complete effacement of the CSF with clumping of the adjacent cauda equina is noted. Moderate severe right foraminal narrowing. Mild central stenosis at L4-5 with moderate to severe right neuroforaminal narrowing. Mild central stenosis at L5-S1 with a left paracentral disc extrusion producing slight compression of the S1 nerve root. Mild central stenosis at L3-4. Mild central stenosis at L1/2.  A/P:  Spencer Cunningham is a very pleasant 68 year old gentleman who presents today with significant neuropathic left leg pain and moderate to severe low back pain. His clinical exam is unchanged. Patient notes increased neuropathic pain when he ambulates that is worsened with extension and improved with forward flexion. Patient's clinical exam is more consistent with lumbar spinal stenosis. He has had a prior L2-3 discectomy for a right-sided disc herniation and now has recurrent severe spinal stenosis primarily due to anterior listhesis at L2-3. Patient also has a disc herniation on the left side at L5-S1. However, on exam he has a negative straight leg raise test, and does not have significant S1 radicular leg pain or dysesthesias. The patient's symptoms in the lower extremity are present predominantly when his spine is in extension. Based on his clinical presentation I believe the primary issue is the L2-3 spinal stenosis. Given the prior decompressive procedure I think it is reasonable to move forward with surgery. He has an anterior listhesis which is contributing to the stenosis and so I believe the best option is a lateral interbody fusion.  I have gone over the surgical procedure in great detail and given a pamphlet that further illustrates the XLIF procedure.  The plan will be a lateral interbody fusion with supplemental posterior pedicle screw fixation. If over the recovery period of 3 to 6 months the neuropathic leg pain does not improve then we could consider moving forward with a supplemental posterior decompression. At that point an extensive decompression could be done since he will be stabilized.  OLIF/XLIF risks, benefits of surgery were reviewed with the patient. These include: infection, bleeding, death, stroke, paralysis, ongoing or worse pain, need for additional surgery, injury to the lumbar plexus resulting in hip flexor weakness and difficulty walking without assistive devices. Adjacent segment degenerative disease, need for additional surgery including fusing other levels, leak of spinal fluid, Nonunion, hardware failure, breakage, or mal-position. Deep venous thrombosis (DVT) requiring additional treatment such as filter, and/or medications. Injury to abdominal contents, loss in bowel and bladder control.  Risks and benefits of posterior spinal fusion: Infection, bleeding, death, stroke, paralysis, ongoing or worse pain, need for additional surgery, nonunion, leak of spinal fluid, adjacent segment degeneration requiring additional fusion surgery, Injury to abdominal vessels that can require anterior surgery to stop bleeding. Malposition of the cage and/or pedicle screws that could require additional surgery. Loss of bowel and bladder control. Postoperative hematoma causing neurologic compression that could  require urgent or emergent re-operation.  Surgical plan is XLIF L2-3 with supplemental posterior pedicle screw fixation. We will utilize the globus expandable intervertebral cage and then NuVasive posterior MIS pedicle screws. Will use an allograft (ossifuse).

## 2023-12-13 NOTE — Discharge Instructions (Signed)

## 2023-12-13 NOTE — Transfer of Care (Signed)
 Immediate Anesthesia Transfer of Care Note  Patient: Spencer Cunningham  Procedure(s) Performed: EXTREME LATERAL INTERBODY FUSION LUMBAR TWO-LUMBAR THREE (Spine Lumbar) POSTERIOR SPINAL FUSION LUMBAR TWO-LUMBAR THREE WITH INSTRUMENTATION (Spine Lumbar)  Patient Location: PACU  Anesthesia Type:General  Level of Consciousness: drowsy and patient cooperative  Airway & Oxygen Therapy: Patient Spontanous Breathing and Patient connected to nasal cannula oxygen  Post-op Assessment: Report given to RN and Post -op Vital signs reviewed and stable  Post vital signs: Reviewed and stable  Last Vitals:  Vitals Value Taken Time  BP 87/48 12/13/23 14:30  Temp    Pulse 56 12/13/23 14:33  Resp 16 12/13/23 14:33  SpO2 96 % 12/13/23 14:33  Vitals shown include unfiled device data.  Last Pain:  Vitals:   12/13/23 0926  TempSrc:   PainSc: 0-No pain         Complications: No notable events documented.

## 2023-12-13 NOTE — Anesthesia Procedure Notes (Signed)
 Procedure Name: Intubation Date/Time: 12/13/2023 10:45 AM  Performed by: Marva Lonni PARAS, CRNAPre-anesthesia Checklist: Patient identified, Emergency Drugs available, Suction available and Patient being monitored Patient Re-evaluated:Patient Re-evaluated prior to induction Oxygen Delivery Method: Circle system utilized Preoxygenation: Pre-oxygenation with 100% oxygen Induction Type: IV induction Ventilation: Mask ventilation without difficulty Laryngoscope Size: Glidescope and 4 Grade View: Grade II Tube type: Oral Number of attempts: 1 Airway Equipment and Method: Stylet and Oral airway Placement Confirmation: ETT inserted through vocal cords under direct vision, positive ETCO2 and breath sounds checked- equal and bilateral Secured at: 22 cm Tube secured with: Tape Dental Injury: Teeth and Oropharynx as per pre-operative assessment

## 2023-12-13 NOTE — Op Note (Signed)
 OPERATIVE REPORT  DATE OF SURGERY: 12/13/2023  PATIENT NAME:  Spencer Cunningham MRN: [id number] DOB: 1955-05-23  PCP: Nanci Senior, MD  PRE-OPERATIVE DIAGNOSIS: Postlaminectomy syndrome with recurrent lumbar spinal stenosis and advanced degenerative disc disease L2-3  POST-OPERATIVE DIAGNOSIS: Same  PROCEDURE:   1.  XLIF L2-3. 2.  Posterior spinal fusion instrumentation L2-3  SURGEON:  Donaciano Sprang, MD  PHYSICIAN ASSISTANT: Jeoffrey Sages, PA  ANESTHESIA:   General  EBL: 50 ml   Complications: None  Implants: Globus expandable Rise-L lateral cage.  7 x 22 x 55.  Parallel.  Expanded to 11.25 mm. NuVasive MIS pedicle screws.  6.5 x 45 mm length.  45 mm length rods  Graft: ossifuse  Neuromonitoring.  No adverse free running EMG or SSEP activity.  All 4 pedicle screws were directly stimulated.  No activity greater than 40 mA.  BRIEF HISTORY: Spencer Cunningham is a 68 y.o. male who has had a prior L2-3 decompression.  Patient began having progressive back buttock and neuropathic thigh pain.  Imaging demonstrated recurrent spinal stenosis with advanced degenerative disc disease L2-3.  As a result of the failure to improve with conservative management we elected to move forward with surgery.  All appropriate risks, benefits, alternatives were discussed with the patient and consent was obtained.  PROCEDURE DETAILS: Patient was brought into the operating room and was properly positioned on the operating room table.  After induction with general anesthesia the patient was endotracheally intubated.  A timeout was taken to confirm all important data: including patient, procedure, and the level. Teds, SCD's were applied.   Foley was placed by the nurse, and the neuromonitoring representative placed all appropriate needles for intraoperative SSEP and EMG monitoring.  The patient was placed in the lateral decubitus position on the pro axis table.  Axillary roll was placed and all bony  prominences well-padded.  Left arm was placed in a well arm holder.  Using imaging I position the patient so that we could clearly visualize the L2-3 disc space level in both planes.  Once he was properly positioned I secured him directly to the table with tape securing the lower extremity in his upper chest to prevent motion during surgery.  Once the patient was secured to the table the abdomen and flank and lumbar spine were prepped and draped in a standard fashion.  Fluoroscopy was used to Quadry out my incision site which spanned the length of the disc space at L2-3.  I infiltrated the incision site with quarter percent Marcaine  with epinephrine  and sharply dissected down to the fascia of the external oblique.  I then bluntly dissected through the external and internal obliques into the retroperitoneal fascia.  I then dissected into the retroperitoneum and began sweeping the adipose tissue in order to eventually palpate the psoas muscle.  Once I was able to palpate the psoas muscle I then placed my first dilator down to the surface of the psoas.  Once I confirmed I was at the posterior third of the disc space I went through the psoas down to the lateral annulus.  I then stimulated circumferentially with the dilator confirming I was not traumatizing the plexus.  I then secured the dilator into the annulus with a guidepin.  I then sequentially dilated and stimulated each time until the final dilator was placed.  I then placed my retractor over the dilator and secured it to the table.  I removed all of the initial dilators and then repositioned my retractor so  that the posterior blade was near the posterior aspect of the vertebral body.  Once I confirm satisfactory positioning of the blade in both planes I then stimulated the posterior aspect of the plate and superior and inferior to the plate to ensure I was not traumatizing the lumbar plexus.  Once this was confirmed I placed my posterior shim to secure the  posterior blade in place.  Once it was placed I then reposition my retractor so that I was parallel to the disc space and I expanded it so I could see the lateral aspect of the disc space.  Satisfied with positioning of the retractor an annulotomy was performed with a 10 blade scalpel.  Cobb elevator was advanced along the endplates of L2 and L3 across the contralateral side.  I then released the contralateral annulus.  The box osteotome was then placed and I remove the bulk of the disc material with a box gouge.  I then used curettes and Kerrison rongeurs as well as pituitary rongeurs to remove all the disc material.  I now had remove the cartilaginous endplate and had bleeding subchondral bone.  I then placed my trialing devices across the disc space.  I then rasped the endplates and irrigated the wound copious normal saline.  I elected to use the 55 mm length 22 mm wide implant.  This provided the best overall contact with the endplates and properly sat in the disc space itself.  I packed this with the bone graft and then inserted into the disc space to the contralateral side.  Once I was satisfied with the positioning of the cage I then expanded the cage to a final height of approximately 11.25 mm.  The cage had excellent contact with the endplates and the foramen were significantly expanded.  At this point I was quite pleased with the indirect decompression and the restoration of the disc height.  I then backfilled the cage with additional bone graft.  Once the cage was full I then irrigated the wound copiously normal saline and removed the retractor blade.  I did put Floseal to aid in hemostasis.  Once all the retractors were removed I took final imaging to confirm satisfactory positioning of the cage and that there were no retained surgical instruments in the field.  The fascia of the external bleak was then closed with interrupted #1 Vicryl suture followed by a layer of interrupted 2-0 Vicryl suture and a  3-0 Monocryl for the skin.  Steri-Strips dry dressing were applied.  With the patient remaining in the lateral decubitus position I marked out the lateral aspect of each of the pedicles posteriorly.  I infiltrated this area with quarter percent Marcaine  and then made a 1 cm incision at each level.  The Jamshidi needle was advanced to the lateral aspect of the pedicle and attached to the neuromonitoring device.  Using fluoroscopy and neuromonitoring I advanced the Jamshidi needle into the pedicle.  As I neared the medial wall of the pedicle on the AP view I switched to the lateral to confirm my trajectory.  Once I confirmed I was just beyond the posterior wall of the vertebral body I advanced the Jamshidi needle into the pedicle.  Once positioned I then placed my guidepin to cannulate the pedicle.  Using this exact same technique I cannulated the remaining 3 pedicles.  Once all 4 pedicles were properly positioned I then measured and elected to use a 6.5 x 45 mm length screws.  These were self-tapping screws.  The screws were advanced over the guidepin and secured into position.  Once all 4 pedicle screws were placed x-rays demonstrated satisfactory positioning.  I then directly stimulated each of the pedicles and there was no adverse activity at greater than 40 mA.  I then measured for the rod and placed the rod percutaneously.  The rod seated in the pedicle screw heads I then placed the locking caps.  Once secured I then tightened the locking caps according manufacture standing with the torque wrench.  Once both rods were properly seated I removed the inserting device and broke off the MIS tabs.  Final imaging demonstrated satisfactory positioning of the pedicle screw rod construct as well as the intervertebral cage.  At this point the 4 incisions were irrigated copiously normal saline and closed in a layered fashion with interrupted #1 Vicryl suture, interrupted 2-0 Vicryl suture, and a 3-0 Monocryl.   Steri-Strips dry dressings were applied and the patient was extubated transfer the PACU without incident.  At the end of the case all needle and sponge counts were correct.  Donaciano Sprang, MD 12/13/2023 1:48 PM

## 2023-12-14 ENCOUNTER — Encounter (HOSPITAL_COMMUNITY): Payer: Self-pay | Admitting: Orthopedic Surgery

## 2023-12-14 NOTE — Progress Notes (Signed)
    Subjective: Procedure(s) (LRB): EXTREME LATERAL INTERBODY FUSION LUMBAR TWO-LUMBAR THREE (N/A) POSTERIOR SPINAL FUSION LUMBAR TWO-LUMBAR THREE WITH INSTRUMENTATION (N/A) 1 Day Post-Op  Patient reports pain as 4 on 0-10 scale.  Reports decreased leg pain, states that he does still have some but it is improved since prior to surgery  denies incisional back pain   Positive void Negative bowel movement Negative flatus Negative chest pain or shortness of breath  Objective: Vital signs in last 24 hours: Temp:  [97.8 F (36.6 C)-98.7 F (37.1 C)] 98.7 F (37.1 C) (08/22 0327) Pulse Rate:  [50-66] 59 (08/22 0327) Resp:  [10-20] 18 (08/22 0327) BP: (87-153)/(48-88) 153/76 (08/22 0327) SpO2:  [93 %-97 %] 96 % (08/22 0327) Weight:  [83 kg] 83 kg (08/21 0856)  Intake/Output from previous day: 08/21 0701 - 08/22 0700 In: 2020 [P.O.:720; I.V.:1200; IV Piggyback:100] Out: 300 [Urine:250; Blood:50]  Labs: No results for input(s): WBC, RBC, HCT, PLT in the last 72 hours. No results for input(s): NA, K, CL, CO2, BUN, CREATININE, GLUCOSE, CALCIUM  in the last 72 hours. No results for input(s): LABPT, INR in the last 72 hours.  Physical Exam: Neurovascular intact Sensation intact distally Intact pulses distally Dorsiflexion/Plantar flexion intact Incision: dressing C/D/I No cellulitis present Compartment soft Body mass index is 27.02 kg/m.   Assessment/Plan:  Spencer Cunningham is a very pleasant 68 year old male who is POD1 from XLIF L2-L3 with PSFI L2-L3. Surgical intervention was successful and without complications. His hospital course has been uncomplicated. He states that his pre-operative leg pain is improved since surgery. He is ambulating on his own. He is tolerating oral intake well. He is compliant with the LSO brace. He is complaint with the incentive spirometer. He reports that his pain is well controled with oral pain medications. Dressing is c/d/I.  Positive void, negative flatus, negative BM.   Plan: Patient stable  Continue mobilization with physical therapy Continue pain medical management  Continue care  Plan to d/c this afternoon vs. tomorrow pending positive flatus, PT/OT clearance, and pain control   Shantice Menger Candance PA-C for Donaciano Sprang, MD [facility] [phone]

## 2023-12-14 NOTE — Evaluation (Signed)
 Occupational Therapy Evaluation and DC Summary  Patient Details Name: Spencer Cunningham MRN: [id number] DOB: [birth year] Today's Date: 12/14/2023   History of Present Illness   Pt is a 68 yo male presenting to [facility] on 12/13/23 for elective XLIF L2-L3 and PSFI L2-L3. PMH of OA, PE, DVT.     Clinical Impressions Pt admitted for above, PTA pt lived with spouse and reports being ind with ADLs and IADLs , reports 2 falls PTA. Pt s/p back sx, educated him on back precautions and compensatory strategies for ADLs. Pt currently needing mod A for LBD and is limited by LLE weakness, completes his other ADLs with supervision/mod I level. He needed cues for good hand placement with RW use, overall able to ambulate with supervision + RW. Pt has no further acute skilled OT needs, no post acute OT needed at DC.      If plan is discharge home, recommend the following:   Assistance with cooking/housework     Functional Status Assessment   Patient has not had a recent decline in their functional status     Equipment Recommendations   Other (comment) (RW vs SPC, pt to follow-up with PT to finalize decision)     Recommendations for Other Services         Precautions/Restrictions   Precautions Precautions: Back Precaution Booklet Issued: Yes (comment) Recall of Precautions/Restrictions: Intact Required Braces or Orthoses: Spinal Brace Spinal Brace: Lumbar corset Restrictions Weight Bearing Restrictions Per Provider Order: No     Mobility Bed Mobility Overal bed mobility: Modified Independent             General bed mobility comments: Pt OOB on arrival.    Transfers Overall transfer level: Needs assistance Equipment used: Rolling walker (2 wheels) Transfers: Sit to/from Stand Sit to Stand: Supervision           General transfer comment: cues for hand placement.      Balance Overall balance assessment: Mild deficits observed, not formally tested                                          ADL either performed or assessed with clinical judgement   ADL Overall ADL's : Needs assistance/impaired Eating/Feeding: Independent;Sitting   Grooming: Standing;Supervision/safety;Set up Grooming Details (indicate cue type and reason): Educated pt on use of cuprs for oral care Upper Body Bathing: Independent;Sitting   Lower Body Bathing: Sitting/lateral leans;Minimal assistance   Upper Body Dressing : Standing;Set up   Lower Body Dressing: Moderate assistance;Sitting/lateral leans Lower Body Dressing Details (indicate cue type and reason): needs assist to get socks and garments donned on weaker L side. Toilet Transfer: Rolling walker (2 wheels);Supervision/safety;Ambulation   Toileting- Clothing Manipulation and Hygiene: Supervision/safety;Sit to/from stand       Functional mobility during ADLs: Supervision/safety;Rolling walker (2 wheels) General ADL Comments: Pt demonstrated ability to don brace independently.     Vision         Perception         Praxis         Pertinent Vitals/Pain Pain Assessment Pain Assessment: Faces Faces Pain Scale: Hurts a little bit Pain Location: op sites Pain Descriptors / Indicators: Aching Pain Intervention(s): Monitored during session, Repositioned     Extremity/Trunk Assessment Upper Extremity Assessment Upper Extremity Assessment: Overall WFL for tasks assessed;Right hand dominant;RUE deficits/detail RUE Deficits / Details: Hx of R rotator injury,  AROM limited to 90 deg but can wash hair. RUE Sensation: WNL RUE Coordination: WNL   Lower Extremity Assessment Lower Extremity Assessment: LLE deficits/detail;Generalized weakness LLE Deficits / Details: hip flexors 3/5 and knee ext 3+/5. decreased sensation below knee to toes. LLE Sensation: decreased light touch   Cervical / Trunk Assessment Cervical / Trunk Assessment: Back Surgery   Communication Communication Communication: No  apparent difficulties   Cognition Arousal: Alert Behavior During Therapy: WFL for tasks assessed/performed Cognition: No apparent impairments                               Following commands: Intact       Cueing  General Comments   Cueing Techniques: Verbal cues  inicision c/d/i   Exercises     Shoulder Instructions      Home Living Family/patient expects to be discharged to:: Private residence Living Arrangements: Spouse/significant other Available Help at Discharge: Family;Available 24 hours/day Type of Home: House Home Access: Stairs to enter [employer] of Steps: 2 Entrance Stairs-Rails: Right;Left;Can reach both Home Layout: Two level Alternate Level Stairs-Number of Steps: 15 Alternate Level Stairs-Rails: Right;Left;Can reach both Bathroom Shower/Tub: Producer, television/film/video: Standard (bilat banisters)     Home Equipment: Shower seat   Additional Comments: Pt reports a couple of falls recently PTA, states he will buy AE to promote independence with ADLs.      Prior Functioning/Environment Prior Level of Function : History of Falls (last six months);Independent/Modified Independent;Driving             Mobility Comments: ind no AD ADLs Comments: ind per report    OT Problem List: Decreased strength;Impaired balance (sitting and/or standing);Decreased knowledge of precautions;Pain   OT Treatment/Interventions:        OT Goals(Current goals can be found in the care plan section)   Acute Rehab OT Goals OT Goal Formulation: All assessment and education complete, DC therapy Time For Goal Achievement: 12/29/23 Potential to Achieve Goals: Good   OT Frequency:       Co-evaluation              AM-PAC OT 6 Clicks Daily Activity     Outcome Measure Help from another person eating meals?: None Help from another person taking care of personal grooming?: A Little Help from another person toileting, which  includes using toliet, bedpan, or urinal?: A Little Help from another person bathing (including washing, rinsing, drying)?: A Little Help from another person to put on and taking off regular upper body clothing?: A Little Help from another person to put on and taking off regular lower body clothing?: A Lot 6 Click Score: 18   End of Session Equipment Utilized During Treatment: Gait belt;Rolling walker (2 wheels);Back brace Nurse Communication: Mobility status  Activity Tolerance: Patient tolerated treatment well Patient left: in bed;with call bell/phone within reach  OT Visit Diagnosis: Muscle weakness (generalized) (M62.81);Pain;History of falling (Z91.81) Pain - part of body:  (back, L flank)                Time: 9174-9144 OT Time Calculation (min): 30 min Charges:  OT General Charges $OT Visit: 1 Visit OT Evaluation $OT Eval Moderate Complexity: 1 Mod OT Treatments $Self Care/Home Management : 8-22 mins  12/14/2023  AB, OTR/L  Acute Rehabilitation Services  Office: [phone]   Curtistine JONETTA Das 12/14/2023, 10:27 AM

## 2023-12-14 NOTE — Evaluation (Signed)
 Physical Therapy Evaluation  Patient Details Name: Spencer Cunningham MRN: [id number] DOB: 1955/05/16 Today's Date: 12/14/2023  History of Present Illness  Pt is a 68 y/o male presenting to [facility] on 12/13/23 for elective XLIF L2-L3 and PSF L2-L3. PMH of OA, PE, DVT.  Clinical Impression  Pt admitted with above diagnosis. At the time of PT eval, pt was able to demonstrate transfers and ambulation with gross CGA  to supervision for safety and RW for support. Pt was educated on precautions, brace application/wearing schedule, appropriate activity progression, and car transfer. Pt currently with functional limitations due to the deficits listed below (see PT Problem List). Pt will benefit from skilled PT to increase their independence and safety with mobility to allow discharge to the venue listed below.          If plan is discharge home, recommend the following: A little help with walking and/or transfers;A little help with bathing/dressing/bathroom;Assistance with cooking/housework;Assist for transportation;Help with stairs or ramp for entrance   Can travel by private vehicle        Equipment Recommendations Rolling walker (2 wheels)  Recommendations for Other Services       Functional Status Assessment Patient has had a recent decline in their functional status and demonstrates the ability to make significant improvements in function in a reasonable and predictable amount of time.     Precautions / Restrictions Precautions Precautions: Back Precaution Booklet Issued: Yes (comment) Recall of Precautions/Restrictions: Intact Precaution/Restrictions Comments: Reviewed handout and pt was cued for precautions during functional mobility. Required Braces or Orthoses: Spinal Brace Spinal Brace: Lumbar corset;Applied in sitting position Restrictions Weight Bearing Restrictions Per Provider Order: No      Mobility  Bed Mobility Overal bed mobility: Modified Independent              General bed mobility comments: HOB flat, use of rails required.    Transfers Overall transfer level: Needs assistance Equipment used: Rolling walker (2 wheels) Transfers: Sit to/from Stand Sit to Stand: Contact guard assist           General transfer comment: VC's for hand placement on seated surface for safety. No assist required however pt was heavily reliant on anterior lean onto walker to brace himself as he powered up.    Ambulation/Gait Ambulation/Gait assistance: Contact guard assist, Supervision Gait Distance (Feet): 200 Feet Assistive device: Rolling walker (2 wheels) Gait Pattern/deviations: Step-through pattern, Decreased stride length, Trunk flexed Gait velocity: Decreased Gait velocity interpretation: 1.31 - 2.62 ft/sec, indicative of limited community ambulator   General Gait Details: Anterior lean onto RW, reporting increased pain with attempts to improve posture to neutral. No overt LOB noted, however steps appear mildly uncoordinated.  Stairs Stairs: Yes Stairs assistance: Contact guard assist Stair Management: Two rails, Step to pattern, Forwards Number of Stairs: 10 General stair comments: VC's for sequencing and general safety.  Wheelchair Mobility     Tilt Bed    Modified Rankin (Stroke Patients Only)       Balance Overall balance assessment: Mild deficits observed, not formally tested                                           Pertinent Vitals/Pain Pain Assessment Pain Assessment: Faces Faces Pain Scale: Hurts a little bit Pain Location: op sites Pain Descriptors / Indicators: Aching Pain Intervention(s): Limited activity within patient's tolerance, Monitored during session,  Repositioned    Home Living Family/patient expects to be discharged to:: Private residence Living Arrangements: Spouse/significant other Available Help at Discharge: Family;Available 24 hours/day Type of Home: House Home Access: Stairs to  enter Entrance Stairs-Rails: Right;Left;Can reach both Entrance Stairs-Number of Steps: 2 Alternate Level Stairs-Number of Steps: 15 Home Layout: Two level Home Equipment: Shower seat Additional Comments: Pt reports a couple of falls recently PTA, states he will buy AE to promote independence with ADLs.    Prior Function Prior Level of Function : History of Falls (last six months);Independent/Modified Independent;Driving             Mobility Comments: ind no AD ADLs Comments: ind per report     Extremity/Trunk Assessment   Upper Extremity Assessment Upper Extremity Assessment: Defer to OT evaluation RUE Deficits / Details: Hx of R rotator injury, AROM limited to 90 deg but can wash hair. RUE Sensation: WNL RUE Coordination: WNL    Lower Extremity Assessment Lower Extremity Assessment: LLE deficits/detail LLE Deficits / Details: Decreased strength and muscular endurance consistent with pre-op diagnosis. Decreased sensation below knee to toes. LLE Sensation: decreased light touch    Cervical / Trunk Assessment Cervical / Trunk Assessment: Back Surgery  Communication   Communication Communication: No apparent difficulties    Cognition Arousal: Alert Behavior During Therapy: WFL for tasks assessed/performed   PT - Cognitive impairments: No apparent impairments                         Following commands: Intact       Cueing Cueing Techniques: Verbal cues     General Comments General comments (skin integrity, edema, etc.): inicision c/d/i    Exercises     Assessment/Plan    PT Assessment Patient needs continued PT services  PT Problem List Decreased range of motion;Decreased strength;Decreased activity tolerance;Decreased balance;Decreased mobility;Decreased knowledge of use of DME;Decreased knowledge of precautions;Decreased safety awareness;Pain       PT Treatment Interventions DME instruction;Gait training;Stair training;Functional mobility  training;Therapeutic activities;Therapeutic exercise;Balance training;Patient/family education    PT Goals (Current goals can be found in the Care Plan section)  Acute Rehab PT Goals Patient Stated Goal: return to exercising PT Goal Formulation: With patient Time For Goal Achievement: 12/21/23 Potential to Achieve Goals: Good    Frequency Min 5X/week     Co-evaluation               AM-PAC PT 6 Clicks Mobility  Outcome Measure Help needed turning from your back to your side while in a flat bed without using bedrails?: A Little Help needed moving from lying on your back to sitting on the side of a flat bed without using bedrails?: A Little Help needed moving to and from a bed to a chair (including a wheelchair)?: A Little Help needed standing up from a chair using your arms (e.g., wheelchair or bedside chair)?: A Little Help needed to walk in hospital room?: A Little Help needed climbing 3-5 steps with a railing? : A Little 6 Click Score: 18    End of Session Equipment Utilized During Treatment: Gait belt;Back brace Activity Tolerance: Patient tolerated treatment well Patient left: in chair;with call bell/phone within reach Nurse Communication: Mobility status PT Visit Diagnosis: Unsteadiness on feet (R26.81);Pain Pain - part of body:  (back)    Time: 8889-8866 PT Time Calculation (min) (ACUTE ONLY): 23 min   Charges:   PT Evaluation $PT Eval Low Complexity: 1 Low PT Treatments $Gait Training:  8-22 mins PT General Charges $$ ACUTE PT VISIT: 1 Visit         Leita Sable, PT, DPT Acute Rehabilitation Services Secure Chat Preferred Office: [phone]   Leita JONETTA Sable 12/14/2023, 12:17 PM

## 2023-12-14 NOTE — Progress Notes (Signed)
 Spencer Cunningham is continuing to do well. Spencer Cunningham is still having some increased back pain. Spencer Cunningham states that his pain remains to range from 4/10 to 6-7/10. Spencer Cunningham is still ambulating on his own. Positive flatus. Spencer Cunningham passed PT/OT. Will d/c to home tomorrow morning. Will continue oxy 10 and tizanidine  for pain control.   Spencer Sages PA-C Orthopedic Surgery [facility] [place] [phone]

## 2023-12-15 MED ORDER — TIZANIDINE HCL 2 MG PO TABS: 2.0000 mg | ORAL_TABLET | Freq: Three times a day (TID) | ORAL | 0 refills | Status: AC | PRN

## 2023-12-15 MED ORDER — ONDANSETRON HCL 4 MG PO TABS: 4.0000 mg | ORAL_TABLET | Freq: Three times a day (TID) | ORAL | 0 refills | Status: AC | PRN

## 2023-12-15 MED ORDER — OXYCODONE-ACETAMINOPHEN 10-325 MG PO TABS: 1.0000 | ORAL_TABLET | Freq: Four times a day (QID) | ORAL | 0 refills | Status: AC | PRN

## 2023-12-15 NOTE — Discharge Summary (Signed)
 Patient ID: Spencer Cunningham MRN: [id number] DOB/AGE: Mar 21, 1956 68 y.o.  Admit date: 12/13/2023 Discharge date: 12/15/2023  Admission Diagnoses:  Principal Problem:   S/P lumbar fusion   Discharge Diagnoses:  Principal Problem:   S/P lumbar fusion  status post Procedure(s): EXTREME LATERAL INTERBODY FUSION LUMBAR TWO-LUMBAR THREE POSTERIOR SPINAL FUSION LUMBAR TWO-LUMBAR THREE WITH INSTRUMENTATION  Past Medical History:  Diagnosis Date   Arthritis    OA- everywhere    Cancer (HCC)    tonsil   Depression    DVT (deep venous thrombosis) (HCC)    RLE; LLE 11/20/04   History of kidney stones    Pulmonary embolism (HCC) 06/22/2012   in [facility], treated first /w Heparin , then Coumadin & now Xarelto ; post-op 07/27/2022    Surgeries: Procedure(s): EXTREME LATERAL INTERBODY FUSION LUMBAR TWO-LUMBAR THREE POSTERIOR SPINAL FUSION LUMBAR TWO-LUMBAR THREE WITH INSTRUMENTATION on 12/13/2023   Consultants:   Discharged Condition: Improved  Hospital Course: Spencer Cunningham is an 68 y.o. male who was admitted 12/13/2023 for operative treatment of S/P lumbar fusion. Patient failed conservative treatments (please see the history and physical for the specifics) and had severe unremitting pain that affects sleep, daily activities and work/hobbies. After pre-op clearance, the patient was taken to the operating room on 12/13/2023 and underwent  Procedure(s): EXTREME LATERAL INTERBODY FUSION LUMBAR TWO-LUMBAR THREE POSTERIOR SPINAL FUSION LUMBAR TWO-LUMBAR THREE WITH INSTRUMENTATION.    Patient was given perioperative antibiotics:  Anti-infectives (From admission, onward)    Start     Dose/Rate Route Frequency Ordered Stop   12/13/23 1900  ceFAZolin  (ANCEF ) IVPB 1 g/50 mL premix        1 g 100 mL/hr over 30 Minutes Intravenous Every 8 hours 12/13/23 1753 12/14/23 0856   12/13/23 0848  ceFAZolin  (ANCEF ) IVPB 2g/100 mL premix        2 g 200 mL/hr over 30 Minutes Intravenous 30 min pre-op  12/13/23 0848 12/13/23 1055        Patient was given sequential compression devices and early ambulation to prevent DVT.   Patient benefited maximally from hospital stay and there were no complications. At the time of discharge, the patient was urinating/moving their bowels without difficulty, tolerating a regular diet, pain is controlled with oral pain medications and they have been cleared by PT/OT.   Recent vital signs: Patient Vitals for the past 24 hrs:  BP Temp Temp src Pulse Resp SpO2  12/15/23 0328 104/65 99 F (37.2 C) Oral 73 18 92 %  12/14/23 2300 116/62 99.8 F (37.7 C) Oral 74 18 93 %  12/14/23 1928 129/78 99.3 F (37.4 C) Oral 65 18 96 %  12/14/23 1708 138/79 98.4 F (36.9 C) Oral 70 20 96 %  12/14/23 1202 135/75 98.6 F (37 C) Oral 62 20 97 %  12/14/23 0725 123/80 98.7 F (37.1 C) Oral (!) 57 19 98 %     Recent laboratory studies: No results for input(s): WBC, HGB, HCT, PLT, NA, K, CL, CO2, BUN, CREATININE, GLUCOSE, INR, CALCIUM  in the last 72 hours.  Invalid input(s): PT, 2   Discharge Medications:   Allergies as of 12/15/2023       Reactions   Penicillins Rash   Childhood allergy., rash        Medication List     STOP taking these medications    Multivitamin Gummies Adult Chew   oxybutynin  5 MG tablet Commonly known as: DITROPAN    phenazopyridine  200 MG tablet Commonly known as: Pyridium   TAKE these medications    atorvastatin  40 MG tablet Commonly known as: LIPITOR Take 40 mg by mouth in the morning.   DULoxetine  60 MG capsule Commonly known as: CYMBALTA  Take 60 mg by mouth in the morning.   ondansetron  4 MG tablet Commonly known as: Zofran  Take 1 tablet (4 mg total) by mouth every 8 (eight) hours as needed for nausea or vomiting.   oxyCODONE -acetaminophen  10-325 MG tablet Commonly known as: Percocet Take 1 tablet by mouth every 6 (six) hours as needed for up to 5 days for pain.   tiZANidine   2 MG tablet Commonly known as: ZANAFLEX  Take 1 tablet (2 mg total) by mouth every 8 (eight) hours as needed for muscle spasms.   traZODone  100 MG tablet Commonly known as: DESYREL  Take 100 mg by mouth at bedtime as needed for sleep.   Xarelto  20 MG Tabs tablet Generic drug: rivaroxaban  Take 20 mg by mouth daily.        Diagnostic Studies: DG Lumbar Spine 2-3 Views Result Date: 12/13/2023 CLINICAL DATA:  Elective surgery. EXAM: LUMBAR SPINE - 2-3 VIEW COMPARISON:  None Available. FINDINGS: Posterior rod and pedicle screw fixation L2-L3 with interbody spacer. Fluoroscopy time 5 minutes 36 seconds. Dose 218.95 mGy. IMPRESSION: Fluoroscopic spot views during lumbar fusion. Electronically Signed   By: Andrea Gasman M.D.   On: 12/13/2023 15:07   DG C-Arm 1-60 Min-No Report Result Date: 12/13/2023 Fluoroscopy was utilized by the requesting physician.  No radiographic interpretation.   DG C-Arm 1-60 Min-No Report Result Date: 12/13/2023 Fluoroscopy was utilized by the requesting physician.  No radiographic interpretation.   DG C-Arm 1-60 Min-No Report Result Date: 12/13/2023 Fluoroscopy was utilized by the requesting physician.  No radiographic interpretation.   DG C-Arm 1-60 Min-No Report Result Date: 12/13/2023 Fluoroscopy was utilized by the requesting physician.  No radiographic interpretation.       Follow-up Information     Burnetta Aures, MD. Schedule an appointment as soon as possible for a visit in 2 week(s).   Specialty: Orthopedic Surgery Why: If symptoms worsen, For suture removal, For wound re-check Contact information: [address] [place] [address] [phone]                 Discharge Plan:  discharge to home today   Disposition:  Spencer Cunningham is a very pleasant 68 year old male who is POD2 from XLIF L2-L3 with PSFI L2-L3. Surgical intervention was successful and without complications. His hospital course has been uncomplicated. He  states that his pre-operative leg pain is improved since surgery. He is ambulating on his own. He is tolerating oral intake well. He is compliant with the LSO brace. He is complaint with the incentive spirometer. He reports that his pain is well controled with oral pain medications. Dressing is c/d/I. Positive void, positive flatus.  PT/OT has been cleared. PT did recommend RW. Patient will follow up with us  in 2 weeks for his first post-op visit. Patient understands that he cannot shower for the first 5 days post-op. All questions were welcomed and addressed.    Signed: Jeoffrey LOISE Sages for Dr. Aures Burnetta [facility] [phone] 12/15/2023, 6:48 AM

## 2023-12-15 NOTE — Progress Notes (Signed)
 Patient alert and oriented, ambulate and void. Surgical site clean and dry no sign of infection. D/c instructions explain and given to the patient.

## 2023-12-15 NOTE — Progress Notes (Signed)
 Physical Therapy Treatment Patient Details Name: Spencer Cunningham MRN: [id number] DOB: January 18, 1956 Today's Date: 12/15/2023   History of Present Illness Pt is a 68 y/o male presenting to [facility] for elective XLIF L2-L3 and PSF L2-L3. PMH of OA, PE, DVT.    PT Comments  Pt mod I bed mobility, supervision transfers, and supervision amb 200' with RW. Pt declined need to practice steps. Reviewed 3/3 back precautions and brace wear schedule. Plan is for d/c home today. Pt's wife able to provide needed level of assist.     If plan is discharge home, recommend the following: A little help with walking and/or transfers;A little help with bathing/dressing/bathroom;Assistance with cooking/housework;Assist for transportation;Help with stairs or ramp for entrance   Can travel by private vehicle        Equipment Recommendations  Rolling walker (2 wheels)    Recommendations for Other Services       Precautions / Restrictions Precautions Precautions: Back Recall of Precautions/Restrictions: Intact Precaution/Restrictions Comments: Reviewed 3/3 back precautions. Pt has handout. Required Braces or Orthoses: Spinal Brace Spinal Brace: Lumbar corset;Applied in sitting position     Mobility  Bed Mobility Overal bed mobility: Modified Independent                  Transfers Overall transfer level: Needs assistance   Transfers: Sit to/from Stand Sit to Stand: Supervision           General transfer comment: Pt demo good technique.    Ambulation/Gait Ambulation/Gait assistance: Supervision Gait Distance (Feet): 200 Feet Assistive device: Rolling walker (2 wheels) Gait Pattern/deviations: Step-through pattern, Decreased stride length Gait velocity: Decreased         Stairs             Wheelchair Mobility     Tilt Bed    Modified Rankin (Stroke Patients Only)       Balance Overall balance assessment: Mild deficits observed, not formally tested                                           Communication Communication Communication: No apparent difficulties  Cognition Arousal: Alert Behavior During Therapy: WFL for tasks assessed/performed   PT - Cognitive impairments: No apparent impairments                         Following commands: Intact      Cueing Cueing Techniques: Verbal cues  Exercises      General Comments        Pertinent Vitals/Pain Pain Assessment Pain Assessment: Faces Faces Pain Scale: Hurts little more Pain Location: back Pain Descriptors / Indicators: Grimacing, Sore Pain Intervention(s): Monitored during session    Home Living                          Prior Function            PT Goals (current goals can now be found in the care plan section) Acute Rehab PT Goals Patient Stated Goal: home Progress towards PT goals: Progressing toward goals    Frequency    Min 5X/week      PT Plan      Co-evaluation              AM-PAC PT 6 Clicks Mobility   Outcome Measure  Help needed turning from your back to your side while in a flat bed without using bedrails?: None Help needed moving from lying on your back to sitting on the side of a flat bed without using bedrails?: None Help needed moving to and from a bed to a chair (including a wheelchair)?: A Little Help needed standing up from a chair using your arms (e.g., wheelchair or bedside chair)?: A Little Help needed to walk in hospital room?: A Little Help needed climbing 3-5 steps with a railing? : A Little 6 Click Score: 20    End of Session Equipment Utilized During Treatment: Gait belt;Back brace Activity Tolerance: Patient tolerated treatment well Patient left: in chair;with call bell/phone within reach Nurse Communication: Mobility status PT Visit Diagnosis: Unsteadiness on feet (R26.81);Pain     Time: 9152-9098 PT Time Calculation (min) (ACUTE ONLY): 14 min  Charges:    $Gait  Training: 8-22 mins PT General Charges $$ ACUTE PT VISIT: 1 Visit                     Sari MATSU., PT  Office # [phone]    Spencer Cunningham 12/15/2023, 10:09 AM

## 2023-12-15 NOTE — Care Management (Signed)
 Patient with order to DC to home today. Unit staff to provide DME needed for home.   No HH needs identified Patient will have family/ friends provide transportation home. No other TOC needs identified for DC
